# Patient Record
Sex: Male | Born: 1935 | Race: Black or African American | Hispanic: No | Marital: Married
Health system: Southern US, Community
[De-identification: ages and names within clinical notes are randomized; demographics above are authoritative.]

## PROBLEM LIST (undated history)

## (undated) DIAGNOSIS — F0150 Vascular dementia without behavioral disturbance: Secondary | ICD-10-CM

## (undated) DIAGNOSIS — I639 Cerebral infarction, unspecified: Secondary | ICD-10-CM

## (undated) DIAGNOSIS — I10 Essential (primary) hypertension: Secondary | ICD-10-CM

## (undated) DIAGNOSIS — J9611 Chronic respiratory failure with hypoxia: Secondary | ICD-10-CM

## (undated) DIAGNOSIS — J449 Chronic obstructive pulmonary disease, unspecified: Secondary | ICD-10-CM

## (undated) DIAGNOSIS — E119 Type 2 diabetes mellitus without complications: Secondary | ICD-10-CM

## (undated) DIAGNOSIS — I4891 Unspecified atrial fibrillation: Secondary | ICD-10-CM

---

## 2008-10-14 ENCOUNTER — Inpatient Hospital Stay: Payer: Self-pay | Admitting: Internal Medicine

## 2009-10-13 ENCOUNTER — Ambulatory Visit: Payer: Self-pay | Admitting: Internal Medicine

## 2011-04-05 ENCOUNTER — Ambulatory Visit: Payer: Self-pay | Admitting: Ophthalmology

## 2011-04-05 DIAGNOSIS — I2510 Atherosclerotic heart disease of native coronary artery without angina pectoris: Secondary | ICD-10-CM

## 2011-04-10 ENCOUNTER — Ambulatory Visit: Payer: Self-pay | Admitting: Ophthalmology

## 2011-06-20 ENCOUNTER — Ambulatory Visit: Payer: Self-pay | Admitting: Ophthalmology

## 2011-06-20 LAB — PROTIME-INR
INR: 2.2
Prothrombin Time: 24.3 secs — ABNORMAL HIGH (ref 11.5–14.7)

## 2011-06-20 LAB — POTASSIUM: Potassium: 3.9 mmol/L (ref 3.5–5.1)

## 2011-06-27 ENCOUNTER — Ambulatory Visit: Payer: Self-pay | Admitting: Ophthalmology

## 2013-10-14 ENCOUNTER — Inpatient Hospital Stay: Payer: Self-pay | Admitting: Internal Medicine

## 2013-10-14 LAB — HEMOGLOBIN A1C: HEMOGLOBIN A1C: 7.7 % — AB (ref 4.2–6.3)

## 2013-10-14 LAB — COMPREHENSIVE METABOLIC PANEL
ALT: 28 U/L (ref 12–78)
Albumin: 3.8 g/dL (ref 3.4–5.0)
Alkaline Phosphatase: 81 U/L
Anion Gap: 7 (ref 7–16)
BUN: 19 mg/dL — ABNORMAL HIGH (ref 7–18)
Bilirubin,Total: 1.3 mg/dL — ABNORMAL HIGH (ref 0.2–1.0)
CHLORIDE: 111 mmol/L — AB (ref 98–107)
CO2: 23 mmol/L (ref 21–32)
CREATININE: 1.93 mg/dL — AB (ref 0.60–1.30)
Calcium, Total: 9.2 mg/dL (ref 8.5–10.1)
EGFR (Non-African Amer.): 31 — ABNORMAL LOW
GFR CALC AF AMER: 36 — AB
Glucose: 125 mg/dL — ABNORMAL HIGH (ref 65–99)
Osmolality: 285 (ref 275–301)
POTASSIUM: 3.8 mmol/L (ref 3.5–5.1)
SGOT(AST): 32 U/L (ref 15–37)
SODIUM: 141 mmol/L (ref 136–145)
Total Protein: 8.7 g/dL — ABNORMAL HIGH (ref 6.4–8.2)

## 2013-10-14 LAB — CBC
HCT: 42.7 % (ref 40.0–52.0)
HGB: 13.6 g/dL (ref 13.0–18.0)
MCH: 26.2 pg (ref 26.0–34.0)
MCHC: 31.8 g/dL — AB (ref 32.0–36.0)
MCV: 82 fL (ref 80–100)
PLATELETS: 283 10*3/uL (ref 150–440)
RBC: 5.19 10*6/uL (ref 4.40–5.90)
RDW: 15.1 % — ABNORMAL HIGH (ref 11.5–14.5)
WBC: 8.4 10*3/uL (ref 3.8–10.6)

## 2013-10-14 LAB — PROTIME-INR
INR: 1.1
PROTHROMBIN TIME: 13.7 s (ref 11.5–14.7)

## 2013-10-14 LAB — TROPONIN I: Troponin-I: 0.04 ng/mL

## 2013-10-14 LAB — PRO B NATRIURETIC PEPTIDE: B-TYPE NATIURETIC PEPTID: 7008 pg/mL — AB (ref 0–450)

## 2013-10-15 LAB — CBC WITH DIFFERENTIAL/PLATELET
Basophil #: 0.1 10*3/uL (ref 0.0–0.1)
Basophil %: 1.3 %
EOS ABS: 0.1 10*3/uL (ref 0.0–0.7)
Eosinophil %: 1.7 %
HCT: 36.9 % — AB (ref 40.0–52.0)
HGB: 12.1 g/dL — AB (ref 13.0–18.0)
LYMPHS ABS: 2.1 10*3/uL (ref 1.0–3.6)
Lymphocyte %: 26.8 %
MCH: 26.4 pg (ref 26.0–34.0)
MCHC: 32.8 g/dL (ref 32.0–36.0)
MCV: 81 fL (ref 80–100)
MONOS PCT: 12.2 %
Monocyte #: 1 x10 3/mm (ref 0.2–1.0)
Neutrophil #: 4.5 10*3/uL (ref 1.4–6.5)
Neutrophil %: 58 %
PLATELETS: 268 10*3/uL (ref 150–440)
RBC: 4.59 10*6/uL (ref 4.40–5.90)
RDW: 14.9 % — ABNORMAL HIGH (ref 11.5–14.5)
WBC: 7.8 10*3/uL (ref 3.8–10.6)

## 2013-10-15 LAB — PROTIME-INR
INR: 1.1
Prothrombin Time: 14.4 secs (ref 11.5–14.7)

## 2013-10-15 LAB — LIPID PANEL
Cholesterol: 140 mg/dL (ref 0–200)
HDL Cholesterol: 34 mg/dL — ABNORMAL LOW (ref 40–60)
Ldl Cholesterol, Calc: 85 mg/dL (ref 0–100)
Triglycerides: 105 mg/dL (ref 0–200)
VLDL Cholesterol, Calc: 21 mg/dL (ref 5–40)

## 2013-10-15 LAB — BASIC METABOLIC PANEL
Anion Gap: 6 — ABNORMAL LOW (ref 7–16)
BUN: 22 mg/dL — ABNORMAL HIGH (ref 7–18)
Calcium, Total: 9 mg/dL (ref 8.5–10.1)
Chloride: 106 mmol/L (ref 98–107)
Co2: 27 mmol/L (ref 21–32)
Creatinine: 1.81 mg/dL — ABNORMAL HIGH (ref 0.60–1.30)
EGFR (African American): 39 — ABNORMAL LOW
GFR CALC NON AF AMER: 34 — AB
Glucose: 156 mg/dL — ABNORMAL HIGH (ref 65–99)
OSMOLALITY: 284 (ref 275–301)
Potassium: 3.5 mmol/L (ref 3.5–5.1)
Sodium: 139 mmol/L (ref 136–145)

## 2013-10-15 LAB — MAGNESIUM: MAGNESIUM: 2 mg/dL

## 2013-10-15 LAB — TSH: Thyroid Stimulating Horm: 3.71 u[IU]/mL

## 2013-10-15 LAB — PRO B NATRIURETIC PEPTIDE: B-TYPE NATIURETIC PEPTID: 11267 pg/mL — AB (ref 0–450)

## 2013-10-16 LAB — BASIC METABOLIC PANEL
ANION GAP: 8 (ref 7–16)
BUN: 28 mg/dL — AB (ref 7–18)
CREATININE: 2.01 mg/dL — AB (ref 0.60–1.30)
Calcium, Total: 8.5 mg/dL (ref 8.5–10.1)
Chloride: 104 mmol/L (ref 98–107)
Co2: 27 mmol/L (ref 21–32)
EGFR (African American): 35 — ABNORMAL LOW
EGFR (Non-African Amer.): 30 — ABNORMAL LOW
GLUCOSE: 151 mg/dL — AB (ref 65–99)
OSMOLALITY: 286 (ref 275–301)
Potassium: 3.6 mmol/L (ref 3.5–5.1)
Sodium: 139 mmol/L (ref 136–145)

## 2013-10-16 LAB — PROTIME-INR
INR: 1.1
PROTHROMBIN TIME: 14.5 s (ref 11.5–14.7)

## 2013-10-17 LAB — BASIC METABOLIC PANEL
Anion Gap: 5 — ABNORMAL LOW (ref 7–16)
BUN: 42 mg/dL — AB (ref 7–18)
CO2: 27 mmol/L (ref 21–32)
Calcium, Total: 8.3 mg/dL — ABNORMAL LOW (ref 8.5–10.1)
Chloride: 105 mmol/L (ref 98–107)
Creatinine: 2.52 mg/dL — ABNORMAL HIGH (ref 0.60–1.30)
EGFR (African American): 26 — ABNORMAL LOW
EGFR (Non-African Amer.): 23 — ABNORMAL LOW
GLUCOSE: 164 mg/dL — AB (ref 65–99)
Osmolality: 288 (ref 275–301)
Potassium: 4.2 mmol/L (ref 3.5–5.1)
Sodium: 137 mmol/L (ref 136–145)

## 2013-10-17 LAB — PROTIME-INR
INR: 1.3
PROTHROMBIN TIME: 15.6 s — AB (ref 11.5–14.7)

## 2013-10-18 LAB — BASIC METABOLIC PANEL
Anion Gap: 8 (ref 7–16)
BUN: 51 mg/dL — ABNORMAL HIGH (ref 7–18)
CALCIUM: 8.1 mg/dL — AB (ref 8.5–10.1)
CREATININE: 2.64 mg/dL — AB (ref 0.60–1.30)
Chloride: 103 mmol/L (ref 98–107)
Co2: 28 mmol/L (ref 21–32)
EGFR (African American): 25 — ABNORMAL LOW
EGFR (Non-African Amer.): 21 — ABNORMAL LOW
Glucose: 146 mg/dL — ABNORMAL HIGH (ref 65–99)
Osmolality: 294 (ref 275–301)
Potassium: 4.3 mmol/L (ref 3.5–5.1)
SODIUM: 139 mmol/L (ref 136–145)

## 2013-10-18 LAB — PHOSPHORUS: PHOSPHORUS: 2.6 mg/dL (ref 2.5–4.9)

## 2013-10-18 LAB — SODIUM, URINE, RANDOM: Sodium, Urine Random: 17 mmol/L (ref 20–110)

## 2013-10-18 LAB — PROTIME-INR
INR: 1.3
Prothrombin Time: 16.1 secs — ABNORMAL HIGH (ref 11.5–14.7)

## 2013-10-18 LAB — PROTEIN / CREATININE RATIO, URINE
Creatinine, Urine: 62.3 mg/dL (ref 30.0–125.0)
PROTEIN/CREAT. RATIO: 273 mg/g{creat} — AB (ref 0–200)
Protein, Random Urine: 17 mg/dL — ABNORMAL HIGH (ref 0–12)

## 2013-10-19 LAB — BASIC METABOLIC PANEL
ANION GAP: 7 (ref 7–16)
BUN: 40 mg/dL — ABNORMAL HIGH (ref 7–18)
CALCIUM: 8.3 mg/dL — AB (ref 8.5–10.1)
CHLORIDE: 106 mmol/L (ref 98–107)
CREATININE: 2.41 mg/dL — AB (ref 0.60–1.30)
Co2: 26 mmol/L (ref 21–32)
EGFR (African American): 28 — ABNORMAL LOW
EGFR (Non-African Amer.): 24 — ABNORMAL LOW
GLUCOSE: 122 mg/dL — AB (ref 65–99)
Osmolality: 289 (ref 275–301)
Potassium: 4 mmol/L (ref 3.5–5.1)
Sodium: 139 mmol/L (ref 136–145)

## 2013-10-19 LAB — PROTIME-INR
INR: 1.5
Prothrombin Time: 18 secs — ABNORMAL HIGH (ref 11.5–14.7)

## 2013-10-19 LAB — HEMOGLOBIN: HGB: 11.1 g/dL — ABNORMAL LOW (ref 13.0–18.0)

## 2013-10-20 LAB — PROTEIN ELECTROPHORESIS(ARMC)

## 2013-10-21 LAB — UR PROT ELECTROPHORESIS, URINE RANDOM

## 2013-12-12 ENCOUNTER — Inpatient Hospital Stay: Payer: Self-pay | Admitting: Internal Medicine

## 2013-12-12 LAB — COMPREHENSIVE METABOLIC PANEL
ALK PHOS: 54 U/L
ALT: 27 U/L
ANION GAP: 9 (ref 7–16)
AST: 29 U/L (ref 15–37)
Albumin: 3.6 g/dL (ref 3.4–5.0)
BUN: 27 mg/dL — ABNORMAL HIGH (ref 7–18)
Bilirubin,Total: 0.6 mg/dL (ref 0.2–1.0)
CALCIUM: 8.7 mg/dL (ref 8.5–10.1)
CO2: 27 mmol/L (ref 21–32)
CREATININE: 2.02 mg/dL — AB (ref 0.60–1.30)
Chloride: 105 mmol/L (ref 98–107)
EGFR (African American): 34 — ABNORMAL LOW
EGFR (Non-African Amer.): 30 — ABNORMAL LOW
GLUCOSE: 123 mg/dL — AB (ref 65–99)
Osmolality: 288 (ref 275–301)
Potassium: 3.6 mmol/L (ref 3.5–5.1)
SODIUM: 141 mmol/L (ref 136–145)
Total Protein: 8.5 g/dL — ABNORMAL HIGH (ref 6.4–8.2)

## 2013-12-12 LAB — CBC
HCT: 39.9 % — ABNORMAL LOW (ref 40.0–52.0)
HGB: 12.7 g/dL — ABNORMAL LOW (ref 13.0–18.0)
MCH: 25.4 pg — ABNORMAL LOW (ref 26.0–34.0)
MCHC: 31.8 g/dL — AB (ref 32.0–36.0)
MCV: 80 fL (ref 80–100)
Platelet: 195 10*3/uL (ref 150–440)
RBC: 4.99 10*6/uL (ref 4.40–5.90)
RDW: 15.5 % — ABNORMAL HIGH (ref 11.5–14.5)
WBC: 6.5 10*3/uL (ref 3.8–10.6)

## 2013-12-12 LAB — URINALYSIS, COMPLETE
BACTERIA: NONE SEEN
Bilirubin,UR: NEGATIVE
GLUCOSE, UR: NEGATIVE mg/dL (ref 0–75)
Ketone: NEGATIVE
Leukocyte Esterase: NEGATIVE
Nitrite: NEGATIVE
Ph: 6 (ref 4.5–8.0)
Protein: NEGATIVE
RBC,UR: 1 /HPF (ref 0–5)
Specific Gravity: 1.005 (ref 1.003–1.030)
WBC UR: NONE SEEN /HPF (ref 0–5)

## 2013-12-12 LAB — PRO B NATRIURETIC PEPTIDE: B-TYPE NATIURETIC PEPTID: 4875 pg/mL — AB (ref 0–450)

## 2013-12-12 LAB — CK TOTAL AND CKMB (NOT AT ARMC)
CK, TOTAL: 231 U/L
CK-MB: 2.3 ng/mL (ref 0.5–3.6)

## 2013-12-12 LAB — TROPONIN I: Troponin-I: <0.02 ng/mL

## 2013-12-12 LAB — PROTIME-INR
INR: 3.1
PROTHROMBIN TIME: 31.2 s — AB (ref 11.5–14.7)

## 2013-12-13 LAB — BASIC METABOLIC PANEL
ANION GAP: 5 — AB (ref 7–16)
BUN: 27 mg/dL — AB (ref 7–18)
Calcium, Total: 8.6 mg/dL (ref 8.5–10.1)
Chloride: 107 mmol/L (ref 98–107)
Co2: 30 mmol/L (ref 21–32)
Creatinine: 1.95 mg/dL — ABNORMAL HIGH (ref 0.60–1.30)
EGFR (African American): 36 — ABNORMAL LOW
EGFR (Non-African Amer.): 31 — ABNORMAL LOW
Glucose: 85 mg/dL (ref 65–99)
OSMOLALITY: 287 (ref 275–301)
POTASSIUM: 3.5 mmol/L (ref 3.5–5.1)
Sodium: 142 mmol/L (ref 136–145)

## 2013-12-13 LAB — CBC WITH DIFFERENTIAL/PLATELET
Basophil #: 0.1 10*3/uL (ref 0.0–0.1)
Basophil %: 1.1 %
EOS PCT: 4.7 %
Eosinophil #: 0.3 10*3/uL (ref 0.0–0.7)
HCT: 38.8 % — ABNORMAL LOW (ref 40.0–52.0)
HGB: 12.6 g/dL — AB (ref 13.0–18.0)
LYMPHS ABS: 2.6 10*3/uL (ref 1.0–3.6)
Lymphocyte %: 46.3 %
MCH: 25.7 pg — ABNORMAL LOW (ref 26.0–34.0)
MCHC: 32.5 g/dL (ref 32.0–36.0)
MCV: 79 fL — ABNORMAL LOW (ref 80–100)
Monocyte #: 0.8 x10 3/mm (ref 0.2–1.0)
Monocyte %: 13.6 %
Neutrophil #: 1.9 10*3/uL (ref 1.4–6.5)
Neutrophil %: 34.3 %
Platelet: 185 10*3/uL (ref 150–440)
RBC: 4.91 10*6/uL (ref 4.40–5.90)
RDW: 15.4 % — AB (ref 11.5–14.5)
WBC: 5.5 10*3/uL (ref 3.8–10.6)

## 2013-12-13 LAB — PROTIME-INR
INR: 3.2
PROTHROMBIN TIME: 31.6 s — AB (ref 11.5–14.7)

## 2013-12-14 LAB — BASIC METABOLIC PANEL
Anion Gap: 7 (ref 7–16)
BUN: 34 mg/dL — ABNORMAL HIGH (ref 7–18)
CHLORIDE: 107 mmol/L (ref 98–107)
CO2: 28 mmol/L (ref 21–32)
CREATININE: 2.15 mg/dL — AB (ref 0.60–1.30)
Calcium, Total: 8.8 mg/dL (ref 8.5–10.1)
EGFR (Non-African Amer.): 27 — ABNORMAL LOW
GFR CALC AF AMER: 32 — AB
Glucose: 137 mg/dL — ABNORMAL HIGH (ref 65–99)
Osmolality: 293 (ref 275–301)
Potassium: 3.7 mmol/L (ref 3.5–5.1)
SODIUM: 142 mmol/L (ref 136–145)

## 2013-12-14 LAB — PROTIME-INR
INR: 2.4
PROTHROMBIN TIME: 25.4 s — AB (ref 11.5–14.7)

## 2013-12-15 LAB — PROTIME-INR
INR: 2.6
Prothrombin Time: 27.3 secs — ABNORMAL HIGH (ref 11.5–14.7)

## 2014-01-06 ENCOUNTER — Ambulatory Visit: Payer: Self-pay | Admitting: Internal Medicine

## 2014-01-18 ENCOUNTER — Inpatient Hospital Stay: Payer: Self-pay | Admitting: Internal Medicine

## 2014-01-18 LAB — COMPREHENSIVE METABOLIC PANEL
ALBUMIN: 3.1 g/dL — AB (ref 3.4–5.0)
ALT: 64 U/L — AB
ANION GAP: 8 (ref 7–16)
AST: 33 U/L (ref 15–37)
Alkaline Phosphatase: 106 U/L
BILIRUBIN TOTAL: 1.1 mg/dL — AB (ref 0.2–1.0)
BUN: 30 mg/dL — AB (ref 7–18)
CO2: 22 mmol/L (ref 21–32)
Calcium, Total: 8.2 mg/dL — ABNORMAL LOW (ref 8.5–10.1)
Chloride: 115 mmol/L — ABNORMAL HIGH (ref 98–107)
Creatinine: 2.16 mg/dL — ABNORMAL HIGH (ref 0.60–1.30)
EGFR (African American): 32 — ABNORMAL LOW
GFR CALC NON AF AMER: 27 — AB
GLUCOSE: 185 mg/dL — AB (ref 65–99)
Osmolality: 300 (ref 275–301)
Potassium: 3.9 mmol/L (ref 3.5–5.1)
SODIUM: 145 mmol/L (ref 136–145)
TOTAL PROTEIN: 7.6 g/dL (ref 6.4–8.2)

## 2014-01-18 LAB — MAGNESIUM: Magnesium: 2.3 mg/dL

## 2014-01-18 LAB — CBC WITH DIFFERENTIAL/PLATELET
Basophil #: 0.1 10*3/uL (ref 0.0–0.1)
Basophil %: 0.8 %
Eosinophil #: 0.2 10*3/uL (ref 0.0–0.7)
Eosinophil %: 2.6 %
HCT: 32.4 % — ABNORMAL LOW (ref 40.0–52.0)
HGB: 10.7 g/dL — ABNORMAL LOW (ref 13.0–18.0)
Lymphocyte #: 1.3 10*3/uL (ref 1.0–3.6)
Lymphocyte %: 15.6 %
MCH: 26.3 pg (ref 26.0–34.0)
MCHC: 33.2 g/dL (ref 32.0–36.0)
MCV: 79 fL — AB (ref 80–100)
MONOS PCT: 13.4 %
Monocyte #: 1.1 x10 3/mm — ABNORMAL HIGH (ref 0.2–1.0)
NEUTROS PCT: 67.6 %
Neutrophil #: 5.7 10*3/uL (ref 1.4–6.5)
PLATELETS: 202 10*3/uL (ref 150–440)
RBC: 4.08 10*6/uL — ABNORMAL LOW (ref 4.40–5.90)
RDW: 17 % — ABNORMAL HIGH (ref 11.5–14.5)
WBC: 8.4 10*3/uL (ref 3.8–10.6)

## 2014-01-18 LAB — URINALYSIS, COMPLETE
Bacteria: NONE SEEN
Bilirubin,UR: NEGATIVE
Glucose,UR: NEGATIVE mg/dL (ref 0–75)
KETONE: NEGATIVE
LEUKOCYTE ESTERASE: NEGATIVE
Nitrite: NEGATIVE
PH: 6 (ref 4.5–8.0)
SPECIFIC GRAVITY: 1.01 (ref 1.003–1.030)
WBC UR: 1 /HPF (ref 0–5)

## 2014-01-18 LAB — PROTIME-INR
INR: 3.1
PROTHROMBIN TIME: 31.2 s — AB (ref 11.5–14.7)

## 2014-01-18 LAB — PRO B NATRIURETIC PEPTIDE: B-Type Natriuretic Peptide: 10541 pg/mL — ABNORMAL HIGH (ref 0–450)

## 2014-01-18 LAB — PHOSPHORUS: PHOSPHORUS: 2.7 mg/dL (ref 2.5–4.9)

## 2014-01-18 LAB — TROPONIN I: Troponin-I: 0.03 ng/mL

## 2014-01-19 LAB — PROTIME-INR
INR: 3.2
Prothrombin Time: 32.1 secs — ABNORMAL HIGH (ref 11.5–14.7)

## 2014-01-20 LAB — CBC WITH DIFFERENTIAL/PLATELET
Basophil #: 0.1 10*3/uL (ref 0.0–0.1)
Basophil %: 1.1 %
Eosinophil #: 0.3 10*3/uL (ref 0.0–0.7)
Eosinophil %: 3.4 %
HCT: 32.4 % — ABNORMAL LOW (ref 40.0–52.0)
HGB: 10.3 g/dL — ABNORMAL LOW (ref 13.0–18.0)
LYMPHS PCT: 22.6 %
Lymphocyte #: 2 10*3/uL (ref 1.0–3.6)
MCH: 25.6 pg — ABNORMAL LOW (ref 26.0–34.0)
MCHC: 31.9 g/dL — AB (ref 32.0–36.0)
MCV: 80 fL (ref 80–100)
MONO ABS: 1.2 x10 3/mm — AB (ref 0.2–1.0)
MONOS PCT: 13.6 %
NEUTROS ABS: 5.3 10*3/uL (ref 1.4–6.5)
Neutrophil %: 59.3 %
Platelet: 210 10*3/uL (ref 150–440)
RBC: 4.04 10*6/uL — AB (ref 4.40–5.90)
RDW: 16.8 % — ABNORMAL HIGH (ref 11.5–14.5)
WBC: 9 10*3/uL (ref 3.8–10.6)

## 2014-01-20 LAB — BASIC METABOLIC PANEL
Anion Gap: 10 (ref 7–16)
BUN: 29 mg/dL — AB (ref 7–18)
CO2: 22 mmol/L (ref 21–32)
Calcium, Total: 8.1 mg/dL — ABNORMAL LOW (ref 8.5–10.1)
Chloride: 113 mmol/L — ABNORMAL HIGH (ref 98–107)
Creatinine: 2.15 mg/dL — ABNORMAL HIGH (ref 0.60–1.30)
EGFR (African American): 32 — ABNORMAL LOW
EGFR (Non-African Amer.): 27 — ABNORMAL LOW
Glucose: 130 mg/dL — ABNORMAL HIGH (ref 65–99)
Osmolality: 296 (ref 275–301)
Potassium: 3.8 mmol/L (ref 3.5–5.1)
SODIUM: 145 mmol/L (ref 136–145)

## 2014-01-20 LAB — PROTIME-INR
INR: 3.6
PROTHROMBIN TIME: 34.9 s — AB (ref 11.5–14.7)

## 2014-01-20 LAB — MAGNESIUM: MAGNESIUM: 2.4 mg/dL

## 2014-01-20 LAB — URINE CULTURE

## 2014-01-21 LAB — BASIC METABOLIC PANEL
Anion Gap: 6 — ABNORMAL LOW (ref 7–16)
BUN: 33 mg/dL — ABNORMAL HIGH (ref 7–18)
CO2: 26 mmol/L (ref 21–32)
CREATININE: 2.06 mg/dL — AB (ref 0.60–1.30)
Calcium, Total: 8.7 mg/dL (ref 8.5–10.1)
Chloride: 108 mmol/L — ABNORMAL HIGH (ref 98–107)
EGFR (African American): 34 — ABNORMAL LOW
EGFR (Non-African Amer.): 29 — ABNORMAL LOW
Glucose: 305 mg/dL — ABNORMAL HIGH (ref 65–99)
Osmolality: 298 (ref 275–301)
Potassium: 4.2 mmol/L (ref 3.5–5.1)
Sodium: 140 mmol/L (ref 136–145)

## 2014-01-21 LAB — CBC WITH DIFFERENTIAL/PLATELET
BASOS ABS: 0 10*3/uL (ref 0.0–0.1)
Basophil %: 0.2 %
EOS PCT: 0.1 %
Eosinophil #: 0 10*3/uL (ref 0.0–0.7)
HCT: 33.6 % — AB (ref 40.0–52.0)
HGB: 11 g/dL — ABNORMAL LOW (ref 13.0–18.0)
LYMPHS PCT: 14.6 %
Lymphocyte #: 0.9 10*3/uL — ABNORMAL LOW (ref 1.0–3.6)
MCH: 26 pg (ref 26.0–34.0)
MCHC: 32.8 g/dL (ref 32.0–36.0)
MCV: 79 fL — ABNORMAL LOW (ref 80–100)
Monocyte #: 0.2 x10 3/mm (ref 0.2–1.0)
Monocyte %: 2.7 %
NEUTROS PCT: 82.4 %
Neutrophil #: 4.9 10*3/uL (ref 1.4–6.5)
PLATELETS: 252 10*3/uL (ref 150–440)
RBC: 4.24 10*6/uL — AB (ref 4.40–5.90)
RDW: 17.7 % — AB (ref 11.5–14.5)
WBC: 5.9 10*3/uL (ref 3.8–10.6)

## 2014-01-21 LAB — PROTIME-INR
INR: 3.3
Prothrombin Time: 32.3 secs — ABNORMAL HIGH (ref 11.5–14.7)

## 2014-01-22 LAB — CREATININE, SERUM
CREATININE: 2.06 mg/dL — AB (ref 0.60–1.30)
EGFR (African American): 34 — ABNORMAL LOW
EGFR (Non-African Amer.): 29 — ABNORMAL LOW

## 2014-01-22 LAB — VANCOMYCIN, TROUGH: VANCOMYCIN, TROUGH: 14 ug/mL (ref 10–20)

## 2014-01-22 LAB — PROTIME-INR
INR: 4.2
Prothrombin Time: 38.9 secs — ABNORMAL HIGH (ref 11.5–14.7)

## 2014-01-23 LAB — CULTURE, BLOOD (SINGLE)

## 2014-01-26 ENCOUNTER — Emergency Department: Payer: Self-pay | Admitting: Emergency Medicine

## 2014-01-26 LAB — COMPREHENSIVE METABOLIC PANEL
ALK PHOS: 74 U/L
ALT: 93 U/L — AB
ANION GAP: 3 — AB (ref 7–16)
AST: 18 U/L (ref 15–37)
Albumin: 3.1 g/dL — ABNORMAL LOW (ref 3.4–5.0)
BUN: 38 mg/dL — ABNORMAL HIGH (ref 7–18)
Bilirubin,Total: 0.4 mg/dL (ref 0.2–1.0)
Calcium, Total: 8 mg/dL — ABNORMAL LOW (ref 8.5–10.1)
Chloride: 108 mmol/L — ABNORMAL HIGH (ref 98–107)
Co2: 28 mmol/L (ref 21–32)
Creatinine: 1.8 mg/dL — ABNORMAL HIGH (ref 0.60–1.30)
GFR CALC AF AMER: 39 — AB
GFR CALC NON AF AMER: 34 — AB
GLUCOSE: 202 mg/dL — AB (ref 65–99)
OSMOLALITY: 292 (ref 275–301)
Potassium: 3.7 mmol/L (ref 3.5–5.1)
Sodium: 139 mmol/L (ref 136–145)
Total Protein: 7.3 g/dL (ref 6.4–8.2)

## 2014-01-26 LAB — CBC
HCT: 33.7 % — AB (ref 40.0–52.0)
HGB: 10.5 g/dL — AB (ref 13.0–18.0)
MCH: 24.9 pg — AB (ref 26.0–34.0)
MCHC: 31 g/dL — AB (ref 32.0–36.0)
MCV: 81 fL (ref 80–100)
PLATELETS: 290 10*3/uL (ref 150–440)
RBC: 4.19 10*6/uL — AB (ref 4.40–5.90)
RDW: 17.3 % — ABNORMAL HIGH (ref 11.5–14.5)
WBC: 8.8 10*3/uL (ref 3.8–10.6)

## 2014-01-26 LAB — URINALYSIS, COMPLETE
Bacteria: NONE SEEN
Bilirubin,UR: NEGATIVE
Blood: NEGATIVE
Glucose,UR: NEGATIVE mg/dL (ref 0–75)
KETONE: NEGATIVE
Leukocyte Esterase: NEGATIVE
Nitrite: NEGATIVE
PROTEIN: NEGATIVE
Ph: 6 (ref 4.5–8.0)
Specific Gravity: 1.01 (ref 1.003–1.030)
WBC UR: <1 /HPF (ref 0–5)

## 2014-01-26 LAB — TROPONIN I

## 2014-01-28 ENCOUNTER — Emergency Department: Payer: Self-pay | Admitting: Emergency Medicine

## 2014-01-29 LAB — COMPREHENSIVE METABOLIC PANEL
ALK PHOS: 83 U/L
ANION GAP: 5 — AB (ref 7–16)
Albumin: 3.2 g/dL — ABNORMAL LOW (ref 3.4–5.0)
BUN: 39 mg/dL — AB (ref 7–18)
Bilirubin,Total: 0.4 mg/dL (ref 0.2–1.0)
CALCIUM: 7.8 mg/dL — AB (ref 8.5–10.1)
Chloride: 102 mmol/L (ref 98–107)
Co2: 31 mmol/L (ref 21–32)
Creatinine: 1.8 mg/dL — ABNORMAL HIGH (ref 0.60–1.30)
EGFR (African American): 47 — ABNORMAL LOW
EGFR (Non-African Amer.): 38 — ABNORMAL LOW
GLUCOSE: 348 mg/dL — AB (ref 65–99)
Osmolality: 299 (ref 275–301)
POTASSIUM: 4.2 mmol/L (ref 3.5–5.1)
SGOT(AST): 11 U/L — ABNORMAL LOW (ref 15–37)
SGPT (ALT): 56 U/L
SODIUM: 138 mmol/L (ref 136–145)
Total Protein: 7.1 g/dL (ref 6.4–8.2)

## 2014-01-29 LAB — CBC WITH DIFFERENTIAL/PLATELET
Basophil #: 0 10*3/uL (ref 0.0–0.1)
Basophil %: 0.6 %
EOS ABS: 0 10*3/uL (ref 0.0–0.7)
EOS PCT: 0.3 %
HCT: 37.3 % — ABNORMAL LOW (ref 40.0–52.0)
HGB: 11.9 g/dL — ABNORMAL LOW (ref 13.0–18.0)
Lymphocyte #: 1.3 10*3/uL (ref 1.0–3.6)
Lymphocyte %: 17.6 %
MCH: 25.7 pg — ABNORMAL LOW (ref 26.0–34.0)
MCHC: 31.9 g/dL — ABNORMAL LOW (ref 32.0–36.0)
MCV: 81 fL (ref 80–100)
MONOS PCT: 10.4 %
Monocyte #: 0.8 x10 3/mm (ref 0.2–1.0)
NEUTROS PCT: 71.1 %
Neutrophil #: 5.3 10*3/uL (ref 1.4–6.5)
PLATELETS: 326 10*3/uL (ref 150–440)
RBC: 4.63 10*6/uL (ref 4.40–5.90)
RDW: 17.7 % — AB (ref 11.5–14.5)
WBC: 7.4 10*3/uL (ref 3.8–10.6)

## 2014-01-29 LAB — TROPONIN I: Troponin-I: <0.02 ng/mL

## 2014-01-29 LAB — PRO B NATRIURETIC PEPTIDE: B-Type Natriuretic Peptide: 3971 pg/mL — ABNORMAL HIGH (ref 0–450)

## 2014-01-31 ENCOUNTER — Emergency Department: Payer: Self-pay | Admitting: Emergency Medicine

## 2014-01-31 LAB — URINALYSIS, COMPLETE
Bacteria: NONE SEEN
Bilirubin,UR: NEGATIVE
GLUCOSE, UR: NEGATIVE mg/dL (ref 0–75)
KETONE: NEGATIVE
LEUKOCYTE ESTERASE: NEGATIVE
NITRITE: NEGATIVE
PH: 7 (ref 4.5–8.0)
Protein: NEGATIVE
RBC,UR: 30 /HPF (ref 0–5)
Specific Gravity: 1.006 (ref 1.003–1.030)
Squamous Epithelial: <1

## 2014-01-31 LAB — CBC WITH DIFFERENTIAL/PLATELET
BASOS PCT: 0.8 %
Basophil #: 0.1 10*3/uL (ref 0.0–0.1)
EOS ABS: 0.2 10*3/uL (ref 0.0–0.7)
EOS PCT: 2.9 %
HCT: 36.8 % — ABNORMAL LOW (ref 40.0–52.0)
HGB: 11.6 g/dL — ABNORMAL LOW (ref 13.0–18.0)
Lymphocyte #: 2.1 10*3/uL (ref 1.0–3.6)
Lymphocyte %: 28.6 %
MCH: 25.4 pg — ABNORMAL LOW (ref 26.0–34.0)
MCHC: 31.5 g/dL — ABNORMAL LOW (ref 32.0–36.0)
MCV: 81 fL (ref 80–100)
Monocyte #: 1.1 x10 3/mm — ABNORMAL HIGH (ref 0.2–1.0)
Monocyte %: 15.6 %
NEUTROS ABS: 3.8 10*3/uL (ref 1.4–6.5)
NEUTROS PCT: 52.1 %
Platelet: 274 10*3/uL (ref 150–440)
RBC: 4.55 10*6/uL (ref 4.40–5.90)
RDW: 18.1 % — ABNORMAL HIGH (ref 11.5–14.5)
WBC: 7.4 10*3/uL (ref 3.8–10.6)

## 2014-01-31 LAB — BASIC METABOLIC PANEL
ANION GAP: 6 — AB (ref 7–16)
BUN: 29 mg/dL — ABNORMAL HIGH (ref 7–18)
CALCIUM: 7.5 mg/dL — AB (ref 8.5–10.1)
CHLORIDE: 109 mmol/L — AB (ref 98–107)
CO2: 27 mmol/L (ref 21–32)
Creatinine: 1.79 mg/dL — ABNORMAL HIGH (ref 0.60–1.30)
EGFR (African American): 47 — ABNORMAL LOW
EGFR (Non-African Amer.): 39 — ABNORMAL LOW
Glucose: 135 mg/dL — ABNORMAL HIGH (ref 65–99)
Osmolality: 291 (ref 275–301)
POTASSIUM: 3.7 mmol/L (ref 3.5–5.1)
Sodium: 142 mmol/L (ref 136–145)

## 2014-01-31 LAB — TROPONIN I

## 2014-02-02 LAB — URINE CULTURE

## 2014-02-05 ENCOUNTER — Ambulatory Visit: Payer: Self-pay | Admitting: Internal Medicine

## 2014-03-11 ENCOUNTER — Ambulatory Visit: Payer: Self-pay | Admitting: Internal Medicine

## 2014-07-08 ENCOUNTER — Inpatient Hospital Stay: Payer: Self-pay | Admitting: Internal Medicine

## 2014-08-03 ENCOUNTER — Inpatient Hospital Stay
Admit: 2014-08-03 | Disposition: A | Discharge status: Skilled Nursing Facility | Payer: Self-pay | Attending: Internal Medicine | Admitting: Internal Medicine

## 2014-08-03 LAB — COMPREHENSIVE METABOLIC PANEL
ALT: 15 U/L — AB
AST: 27 U/L
Albumin: 2 g/dL — ABNORMAL LOW
Alkaline Phosphatase: 161 U/L — ABNORMAL HIGH
Anion Gap: 6 — ABNORMAL LOW (ref 7–16)
BUN: 34 mg/dL — AB
Bilirubin,Total: 1.2 mg/dL
CALCIUM: 8 mg/dL — AB
CO2: 25 mmol/L
Chloride: 118 mmol/L — ABNORMAL HIGH
Creatinine: 1.66 mg/dL — ABNORMAL HIGH
GFR CALC AF AMER: 44 — AB
GFR CALC NON AF AMER: 38 — AB
Glucose: 114 mg/dL — ABNORMAL HIGH
POTASSIUM: 4.1 mmol/L
Sodium: 149 mmol/L — ABNORMAL HIGH
TOTAL PROTEIN: 7.1 g/dL

## 2014-08-03 LAB — PROTIME-INR
INR: 2.8
PROTHROMBIN TIME: 29.4 s — AB

## 2014-08-03 LAB — URINALYSIS, COMPLETE
BILIRUBIN, UR: NEGATIVE
Blood: NEGATIVE
Glucose,UR: NEGATIVE mg/dL (ref 0–75)
KETONE: NEGATIVE
LEUKOCYTE ESTERASE: NEGATIVE
Nitrite: NEGATIVE
Ph: 6 (ref 4.5–8.0)
Protein: NEGATIVE
RBC,UR: NONE SEEN /HPF (ref 0–5)
Specific Gravity: 1.009 (ref 1.003–1.030)

## 2014-08-03 LAB — CBC
HCT: 33.9 % — ABNORMAL LOW (ref 40.0–52.0)
HGB: 10.8 g/dL — ABNORMAL LOW (ref 13.0–18.0)
MCH: 23.7 pg — ABNORMAL LOW (ref 26.0–34.0)
MCHC: 31.8 g/dL — AB (ref 32.0–36.0)
MCV: 74 fL — ABNORMAL LOW (ref 80–100)
Platelet: 299 10*3/uL (ref 150–440)
RBC: 4.56 10*6/uL (ref 4.40–5.90)
RDW: 17.8 % — AB (ref 11.5–14.5)
WBC: 9.1 10*3/uL (ref 3.8–10.6)

## 2014-08-03 LAB — TROPONIN I: TROPONIN-I: 0.04 ng/mL — AB

## 2014-08-04 LAB — CBC WITH DIFFERENTIAL/PLATELET
Basophil #: 0 10*3/uL (ref 0.0–0.1)
Basophil %: 0.5 %
EOS ABS: 0.2 10*3/uL (ref 0.0–0.7)
Eosinophil %: 1.8 %
HCT: 30.9 % — ABNORMAL LOW (ref 40.0–52.0)
HGB: 9.7 g/dL — ABNORMAL LOW (ref 13.0–18.0)
LYMPHS ABS: 2 10*3/uL (ref 1.0–3.6)
Lymphocyte %: 21.4 %
MCH: 23.8 pg — AB (ref 26.0–34.0)
MCHC: 31.3 g/dL — AB (ref 32.0–36.0)
MCV: 76 fL — ABNORMAL LOW (ref 80–100)
Monocyte #: 1.1 x10 3/mm — ABNORMAL HIGH (ref 0.2–1.0)
Monocyte %: 12 %
NEUTROS ABS: 5.9 10*3/uL (ref 1.4–6.5)
Neutrophil %: 64.3 %
Platelet: 318 10*3/uL (ref 150–440)
RBC: 4.07 10*6/uL — ABNORMAL LOW (ref 4.40–5.90)
RDW: 17.9 % — ABNORMAL HIGH (ref 11.5–14.5)
WBC: 9.1 10*3/uL (ref 3.8–10.6)

## 2014-08-04 LAB — BASIC METABOLIC PANEL
ANION GAP: 6 — AB (ref 7–16)
BUN: 30 mg/dL — ABNORMAL HIGH
CHLORIDE: 119 mmol/L — AB
CREATININE: 1.59 mg/dL — AB
Calcium, Total: 7.5 mg/dL — ABNORMAL LOW
Co2: 23 mmol/L
EGFR (African American): 46 — ABNORMAL LOW
GFR CALC NON AF AMER: 40 — AB
Glucose: 96 mg/dL
POTASSIUM: 5.1 mmol/L
SODIUM: 148 mmol/L — AB

## 2014-08-04 LAB — MRSA PCR SCREENING

## 2014-08-05 ENCOUNTER — Ambulatory Visit: Admit: 2014-08-05 | Disposition: A | Discharge status: Home / Self Care | Payer: Self-pay | Admitting: Neurology

## 2014-08-05 LAB — BASIC METABOLIC PANEL
ANION GAP: 6 — AB (ref 7–16)
BUN: 29 mg/dL — ABNORMAL HIGH
CO2: 23 mmol/L
CREATININE: 1.52 mg/dL — AB
Calcium, Total: 7.8 mg/dL — ABNORMAL LOW
Chloride: 116 mmol/L — ABNORMAL HIGH
EGFR (Non-African Amer.): 42 — ABNORMAL LOW
GFR CALC AF AMER: 48 — AB
Glucose: 92 mg/dL
Potassium: 4.2 mmol/L
Sodium: 145 mmol/L

## 2014-08-05 LAB — PROTIME-INR
INR: 3.7
PROTHROMBIN TIME: 37 s — AB

## 2014-08-06 LAB — PROTIME-INR
INR: 3.7
PROTHROMBIN TIME: 36.7 s — AB

## 2014-08-08 LAB — CULTURE, BLOOD (SINGLE)

## 2014-08-10 ENCOUNTER — Other Ambulatory Visit: Admit: 2014-08-10 | Disposition: A | Discharge status: Home / Self Care | Payer: Self-pay

## 2014-08-10 LAB — PROTIME-INR
INR: 3.5
PROTHROMBIN TIME: 35.2 s — AB

## 2014-08-18 ENCOUNTER — Emergency Department
Admit: 2014-08-18 | Disposition: A | Discharge status: Home / Self Care | Payer: Self-pay | Admitting: Emergency Medicine

## 2014-08-18 LAB — URINALYSIS, COMPLETE
BACTERIA: NONE SEEN
BILIRUBIN, UR: NEGATIVE
Blood: NEGATIVE
GLUCOSE, UR: NEGATIVE mg/dL (ref 0–75)
Ketone: NEGATIVE
Leukocyte Esterase: NEGATIVE
NITRITE: NEGATIVE
Ph: 6 (ref 4.5–8.0)
Protein: NEGATIVE
SPECIFIC GRAVITY: 1.011 (ref 1.003–1.030)

## 2014-08-18 LAB — CBC WITH DIFFERENTIAL/PLATELET
BASOS PCT: 1.6 %
Basophil #: 0.1 10*3/uL (ref 0.0–0.1)
Eosinophil #: 0.2 10*3/uL (ref 0.0–0.7)
Eosinophil %: 3.3 %
HCT: 33.3 % — AB (ref 40.0–52.0)
HGB: 10.4 g/dL — AB (ref 13.0–18.0)
LYMPHS ABS: 2 10*3/uL (ref 1.0–3.6)
Lymphocyte %: 29.3 %
MCH: 23.4 pg — AB (ref 26.0–34.0)
MCHC: 31.3 g/dL — ABNORMAL LOW (ref 32.0–36.0)
MCV: 75 fL — ABNORMAL LOW (ref 80–100)
MONO ABS: 0.8 x10 3/mm (ref 0.2–1.0)
Monocyte %: 11.5 %
Neutrophil #: 3.7 10*3/uL (ref 1.4–6.5)
Neutrophil %: 54.3 %
Platelet: 565 10*3/uL — ABNORMAL HIGH (ref 150–440)
RBC: 4.46 10*6/uL (ref 4.40–5.90)
RDW: 19.8 % — ABNORMAL HIGH (ref 11.5–14.5)
WBC: 6.8 10*3/uL (ref 3.8–10.6)

## 2014-08-18 LAB — COMPREHENSIVE METABOLIC PANEL
Albumin: 2.3 g/dL — ABNORMAL LOW
Alkaline Phosphatase: 168 U/L — ABNORMAL HIGH
Anion Gap: 6 — ABNORMAL LOW (ref 7–16)
BUN: 24 mg/dL — ABNORMAL HIGH
Bilirubin,Total: 0.9 mg/dL
CALCIUM: 8.3 mg/dL — AB
CO2: 26 mmol/L
Chloride: 107 mmol/L
Creatinine: 1.37 mg/dL — ABNORMAL HIGH
GFR CALC AF AMER: 55 — AB
GFR CALC NON AF AMER: 47 — AB
GLUCOSE: 145 mg/dL — AB
Potassium: 4.7 mmol/L
SGOT(AST): 29 U/L
SGPT (ALT): 21 U/L
Sodium: 139 mmol/L
Total Protein: 8.2 g/dL — ABNORMAL HIGH

## 2014-08-18 LAB — CK TOTAL AND CKMB (NOT AT ARMC)
CK, Total: 104 U/L
CK-MB: 2.4 ng/mL

## 2014-08-18 LAB — TROPONIN I: Troponin-I: <0.03 ng/mL

## 2014-08-21 ENCOUNTER — Other Ambulatory Visit: Admit: 2014-08-21 | Disposition: A | Discharge status: Home / Self Care | Payer: Self-pay

## 2014-08-21 LAB — PROTIME-INR
INR: 1.2
Prothrombin Time: 14.9 secs

## 2014-08-24 ENCOUNTER — Other Ambulatory Visit: Admit: 2014-08-24 | Disposition: A | Discharge status: Home / Self Care | Payer: Self-pay

## 2014-08-24 LAB — PROTIME-INR
INR: 1.2
Prothrombin Time: 15.9 secs — ABNORMAL HIGH

## 2014-08-29 NOTE — Discharge Summary (Signed)
PATIENT NAME:  John Thompson, John MR#:  [id number] DATE OF BIRTH:  [birth year]  DATE OF ADMISSION:  10/14/2013 DATE OF DISCHARGE:  10/19/2013  ADMISSION DIAGNOSIS: Acute on chronic systolic heart failure.   DISCHARGE DIAGNOSES: 1. Acute on chronic systolic heart failure with worsening ejection fraction of 25%.  2. Acute renal failure on chronic kidney disease.  3. Hypertension.  4. Benign prostatic hypertrophy.  5. Atrial fibrillation.  6. Altered mental, likely dementia.   CONSULTATIONS:  1. Dr. Adrian BlackwaterShaukat Khan.  2. Dr. Cherylann RatelLateef.   LABORATORIES AT DISCHARGE: Sodium 139, potassium 4.0, chloride 106, bicarbonate 26, BUN 40, creatinine 2.4, glucose 122. INR is 1.5, hemoglobin 11.   HOSPITAL COURSE: An 81 year old male who presented on 06/09 with shortness of breath and a fall. For further details, please refer to the H and P.  1. Acute on chronic systolic heart failure. The patient was found to have acute on chronic systolic heart failure by elevated BNP and chest x-ray. He is admitted for congestive heart failure exacerbation. Echocardiogram was performed which showed an ejection fraction of 25%. According to Dr. Welton FlakesKhan, the patient has been noncompliant and EF has worsened when compared to his last echo in his office. He was initially on diuresis with Lasix. He has diuresed well, but his creatinine did bump up with a little bit so it seems that he was over diuresed. At this time, we are holding Lasix and he will have daily BMPs once his creatinine is at baseline, which is about 1.8, 1.9 he may restart Lasix 40 mg daily. He will continued on aspirin and Coreg.  2. Acute renal failure on chronic kidney disease with his baseline creatinine from about 1.7 to 1.9. He may have had a component of ATN from low blood pressure and overdiuresis. We are holding losartan at this time.  3. Hypertension. We are holding losartan. His blood pressure is low-normal.  4. Benign prostatic hypertrophy on Flomax and Proscar.  5.  Atrial fibrillation. The patient's heart rate is controlled. His INR is 1.5 at discharge. Will need daily INR checks until goal 2 to 3.  6. Altered mental status. Likely dementia with some personality changes. The patient was started on risperidone.   DISCHARGE MEDICATIONS: 1. Aspirin 81 mg daily.  2. Coumadin 4 mg daily.  3. Flomax 0.4 mg daily.  4. Finasteride 5 mg daily.  5. Humulin R sliding scale insulin.  6. Simvastatin 20 mg at bedtime.  7. Risperdal 0.5 mg b.i.d.  8. Coreg 6.25 b.i.d.  9. Lasix 40 mg daily to be started once creatinine is less than 1.9.    DISCHARGE DIET: ADA low-sodium diet.  DISCHARGE ACTIVITY: As tolerated.  DISCHARGE FOLLOW-UP: The patient to follow up in one week with Dr. Cherylann RatelLateef. The patient should have a BMP daily starting Monday, well as PT/INR once creatinine is less than 1.9 may  restart Lasix 40 mg daily, then check BMP weekly to ensure creatinine is stable. Check INR daily if the patient is on Coumadin. Discharge  INR is 1.5, needs to be between 2 to 3.   The patient will follow up in one week with Dr. Cherylann RatelLateef  and Dr. Hillery AldoSarah Patel.   TIME SPENT: Approximately 35 minutes.  The patient stable for discharge.   ____________________________ Shahzad Thomann P. Juliene PinaMody, MD spm:sg D: 10/19/2013 09:03:58 ET T: 10/19/2013 09:44:16 ET JOB#: [id number]  cc: Devoiry Corriher P. Juliene PinaMody, MD, <Dictator> Munsoor Lizabeth LeydenN. Lateef, MD Sarah "Maryruth HancockSallie" Patel, MD  Janyth ContesSITAL P Matika Bartell MD ELECTRONICALLY SIGNED 10/19/2013 10:34

## 2014-08-29 NOTE — Consult Note (Signed)
   Comments   Josh Borders, NP, and I met with pt's daughter, Joyce. Updated her on pt's current condition. Daughter is pleased with pt's improvement. Daughter was pleased with the care pt has received at [facility] and hopes he can return there at discharge. We discussed the involvement of hospice at ALF and daughter seemed interested in this. We also discussed pt's code status with daughter. She became emotional when talking about end of life issues and requests that pt remain a full code.  expressed appreciation for meeting. All questions answered.   Electronic Signatures: ,  (MD)  (Signed 14-Sep-15 20:22)  Authored: Palliative Care   Last Updated: 14-Sep-15 20:22 by ,  (MD) 

## 2014-08-29 NOTE — H&P (Signed)
PATIENT NAME:  John Thompson, John Thompson MR#:  [id number] DATE OF BIRTH:  1929-03-22  DATE OF ADMISSION:  10/14/2013  REASON FOR ADMISSION: Shortness of breath, status post fall as well.   PRIMARY CARE PHYSICIAN: The patient states he does not have one. He only visits Dr. Adrian Blackwater, cardiologist.  REFERRING PHYSICIAN: Jene Every, MD  HISTORY OF PRESENT ILLNESS: This is an 81 year old gentleman with previous history of non-STEMI, CHF with an ejection fraction of 34% on his last catheterization, also has paroxysmal atrial fibrillation for what he is on Coumadin, benign prostatic hypertrophy and hypertension. He takes metformin for diabetes. The patient comes today with a history of being his normal self, falling yesterday at his house. A neighbor helped him out. He stayed in bed, but this morning he started to have a little bit more of shortness of breath. He was able to walk. He was able to do all his activities. He just was feeling a little bit tender at the level of the right chest wall as he hit one of his ribs whenever he was getting up. The patient states that he hit his head really hard as well, but he did not have any significant changes of mental status. The patient is coming in today because he continues to have some pain at the level of his rib cage and on top of that he has shortness of breath.   REVIEW OF SYSTEMS: The patient is a very poor historian, but he is able to tell me that he is short of breath since this morning. He states that he does not have any other problems. He did not know that he had CHF, but on his records it has been well documented. CONSTITUTIONAL: No fever, fatigue, weight loss or weight gain.  EYES: No blurry vision, double vision.  EARS, NOSE, THROAT: No difficulty swallowing, tinnitus.  RESPIRATORY: No cough, wheezing, or hemoptysis.  CARDIOVASCULAR: No chest pain or orthopnea.  GASTROINTESTINAL: No nausea, vomiting, abdominal pain, constipation, diarrhea.   GENITOURINARY: No dysuria, hematuria, changes in frequency.  ENDOCRINE: No polyuria, polydipsia, polyphagia, cold or heat intolerance.  HEMATOLOGIC AND LYMPHATIC: No anemia, easy bruising or bleeding.  SKIN: No rash or petechiae.  MUSCULOSKELETAL: No significant neck pain, back pain or arthritis.  NEUROLOGIC: No numbness, tingling, or CVA.  PSYCHIATRIC: No insomnia. The patient denies any history of dementia. Actually whenever he is examined, he has very good short-term and long-term memory. He is just peculiar possibly with a very low level of education.   PAST MEDICAL HISTORY: 1.  Hypertension.  2.  BPH. 3.  Paroxysmal atrial fibrillation.  4.  Coronary artery disease.  5.  CHF with ejection fraction of 35%.   PAST SURGICAL HISTORY: Possible biopsy of his prostate. The patient states that he does not know if he had anything done or not.   ALLERGIES: No known drug allergies.   MEDICATIONS: Zocor 20 mg at bedtime, Toprol-XL 50 mg daily, metformin 500 mg twice daily, Lasix 40 mg once a day, Flomax 0.4 mg daily, finasteride 5 mg daily, Cozaar 100 mg daily, Coumadin 4 mg daily, aspirin 81 mg daily.   SOCIAL HISTORY: The patient lives by himself. He cooks. He drives. He gets around. He denies any alcohol abuse. He smokes 5 to 6 cigarettes a day. Smoking cessation counseling given to the patient for over 4 minutes. The patient says that he is not ready to quit smoking. The patient has 3 daughters and he is divorced.  FAMILY HISTORY: Negative  for diabetes, hypertension, myocardial infarction. His father and mother lived into their 10s.   PHYSICAL EXAMINATION: VITAL SIGNS: Blood pressure 182/98, pulse 105, respirations 24, temperature 97.9, oxygen saturation 88% on room air.  GENERAL: The patient is alert and oriented x3, in no acute distress. No respiratory distress. Hemodynamically stable.  HEENT: Pupils are equal and reactive. Extraocular movements are intact. Mucosa is moist. Anicteric  sclerae. Pink conjunctivae. No oral lesions. No oropharyngeal exudate.  NECK: Supple. No JVD. No thyromegaly. No adenopathy. No carotid bruits.  CARDIOVASCULAR: Regular rate and rhythm. No murmurs, rubs, or gallops appreciated. Positive S3. No displacement of PMI. No tenderness to palpation of anterior chest wall.  LUNGS: Showing some crackles in both bases with no use of accessory muscles at this moment. Good air expansion, just diffuse crackles. No use of accessory muscles. No dullness to percussion.  ABDOMEN: Soft, nontender, nondistended. No hepatosplenomegaly. No masses. Bowel sounds are positive.  GENITAL: Deferred.  EXTREMITIES: Positive edema, +1 of the lower extremities. No cyanosis or clubbing.  VASCULAR: Pulses +2. Capillary refill less than 3.  NEUROLOGIC: Cranial nerves II through XII intact. Strength is 5/5 all 4 extremities. No focal findings.  PSYCHIATRIC: No agitation. The patient is alert and oriented x3.  LYMPHATIC: Negative for lymphadenopathy in neck or supraclavicular areas.  MUSCULOSKELETAL: No significant joint effusions or swelling.  SKIN: No rashes or petechiae.   DIAGNOSTIC DATA: BNP 7000. Glucose 125, BUN 19, creatinine 1.93, sodium 141, potassium 3.8, chloride 111, albumin 1.3, bilirubin 1.3. His previous creatinine is somewhere around 1.4 to 1.7 so slightly elevated from previous. His white blood count is 8.4, hemoglobin 13, and platelet count 283,000. His last INR in December 2013 was 2.2, right now I am ordering one stat.  EKG: No ST depression or elevation. Normal sinus rhythm with sinus tachycardia.   Chest x-ray: Pulmonary interstitial and alveolar edema likely secondary to CHF.   ASSESSMENT AND PLAN: An  81 year old gentleman with non-ST elevation myocardial infarction in the past, coronary artery disease, paroxysmal atrial fibrillation, benign prostatic hypertrophy, and congestive heart failure with ejection fraction 35% who comes status post fall and  shortness of breath.  1.  Status post fall. The patient had a fall yesterday night. He came back today because he continued to have pain in the rib cage and on his head. The patient says that he bumped really hard his right side of the head, but he did not have any significant bleeding. The patient is actually on Coumadin for what I am going to order a stat CT scan of the head.  2.  Shortness of breath. The patient started having some shortness of breath today. Chest x-ray shows signs of pulmonary edema and his oxygen saturation was 88%.  3.  Congestive heart failure. The patient has history of systolic dysfunction with ejection fraction of 35%, which seems to be of origin ischemic. The patient had non-ST elevation myocardial infarction in the past. As far as his congestive heart failure, he is in acute exacerbation with alveolar edema. Lasix 10 mg IV q. 12, increase if necessary. Monitor diuresis. Monitor daily weights. Low-salt diet. Continue beta blocker. Continue Cozaar which is an ARB. Obtain a new echocardiogram as it has been over 5 years since his ejection fraction has been addressed or reassessed. Replace potassium as needed and add on potassium twice daily p.o. as the patient is going to be on IV Lasix.  4.  Hypertension. The patient has elevation of his blood pressure to  the 180s/90s. The patient has not had his medications. We are going to restart metoprolol and Cozaar and give him some Lasix.  5.  Diabetes. Hold metformin. The patient has slight elevation of his blood sugar. We are going to put him on insulin sliding scale, low carbohydrate diet, low salt diet. Check hemoglobin A1c. Anticoagulation with Coumadin. We are going to check his INR stat and check it daily.  6.  Paroxysmal atrial fibrillation. At this moment, he is in normal sinus rhythm. Continue beta blocker, continue anticoagulation.  7.  Deep vein thrombosis prophylaxis with Coumadin.  8.  Gastrointestinal prophylaxis with  Protonix.  9.  Chronic kidney disease. His GFR is slightly decreased. This is possibly due to worsening of his known chronic kidney disease versus acute process. We are going to continue to monitor. Continue Lasix for now. I am going to continue the Cozaar for now, but if there is any further worsening we will hold the Cozaar.   TIME SPENT: 45 minutes. ____________________________ Felipa Furnaceoberto Sanchez Gutierrez, MD rsg:sb D: 10/14/2013 15:32:39 ET T: 10/14/2013 17:00:15 ET JOB#: [id number]  cc: Felipa Furnaceoberto Sanchez Gutierrez, MD, <Dictator> Konner Warrior Juanda ChanceSANCHEZ GUTIERRE MD ELECTRONICALLY SIGNED 10/29/2013 11:22

## 2014-08-29 NOTE — Discharge Summary (Signed)
PATIENT NAME:  John Thompson, Rhone MR#:  [id number] DATE OF BIRTH:  Nov 05, 1928  DATE OF ADMISSION:  01/18/2014 DATE OF DISCHARGE:    ADMITTING PHYSICIAN:  Dr. Randol KernElgergawy.    DISCHARGING PHYSICIAN:  Dr. Enid Baasadhika Shafter Jupin.    PRIMARY CARE MEDICAL DOCTOR:  Dr. Adrian BlackwaterShaukat Khan.   CONSULTATIONS IN HOSPITAL: Palliative care consultation by Dr. Harriett SineNancy Phifer.   DISCHARGE DIAGNOSES:  1.  Acute on chronic systolic congestive heart failure exacerbation.  2.  Healthcare-acquired pneumonia.    3.  Dementia with behavioral disturbances.  4.  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  5.  Atrial fibrillation/flutter.  6.  Benign prostatic hypertrophy.  7.  Diabetes mellitus.   DISCHARGE HOME MEDICATIONS:  1.  Finasteride 5 mg p.o. q. daily.  2.  Flomax 0.4 mg p.o. q. daily.  3.  Coreg 6.25 mg p.o. b.i.d.  4.  Risperidone 0.5 mg p.o. b.i.d.  5.  Clorazepate 3.75 mg 1 tablet at bedtime as needed for sleep.  6.  Coumadin 3 mg p.o. q. daily.  7.  Lantus 12 units subcutaneous in the morning.  8.  Lasix 40 mg p.o. q. daily.  9.  Prednisone taper.  10.  Levaquin 250 mg p.o. q. daily.  11.  DuoNebs 3 mL q. 8 hours p.r.n. for wheezing.   HOME OXYGEN:  3 liters.   DISCHARGE DIET: Low-sodium diet with mechanical soft, thin liquids, aspiration precautions.   FOLLOWUP INSTRUCTIONS:  1.  PCP followup in 1 week.  2.  Home hospice services.  3.  Cardiology followup in 1-2 weeks.  4.  Strict fluid restriction up to 1800 mL per day.  5.  INR check in 3 days.  6.  Coumadin to be restarted in 2 days.    LABORATORIES AND IMAGING STUDIES PRIOR TO DISCHARGE:  1.  INR was 4.2.  2.  WBC was 5.9, hemoglobin 11.0, hematocrit 33.6, platelet count 252,000.  3.  Sodium 140, potassium 4.2, chloride 108, bicarbonate 26, BUN 33, creatinine 2.06, glucose of 305, and calcium of 8.7.  4.  Chest x-ray prior to discharge showing patchy infiltrate bilaterally showing some improvement from previous study.   BRIEF HOSPITAL COURSE:  John Thompson is an 81 year old African-American male with past medical history significant for dementia with behavioral disturbances, hypertension, diabetes, systolic CHF, EF of 20%, and CKD, atrial fibrillation, who presents secondary to worsening breathing and also had fevers on admission.   1.  Health-care acquired pneumonia, recently been in rehabilitation so was placed on broad-spectrum antibiotics, once cultures are negative being tapered off and changed over to Levaquin at the time of discharge. Chest x-ray did show some improvement.   2.  Acute on chronic systolic CHF exacerbation. The patient is very noncompliant with his fluid restriction. He is already being diuresed with Lasix. He was placed on strict fluid restriction and started back on Lasix IV, changed over to p.o. at the time of discharge. He is already on carvedilol and not on any ACE inhibitor or ARB due to his chronic kidney disease. He is well compensated at this time and will follow up with Dr. Welton FlakesKhan as an outpatient.  3.  Atrial fibrillation, rate is well controlled. He is on Coreg, he is also on Coumadin. The INR is high because since he has been started on Levaquin. His Coumadin needs dose adjusted and he will have a followup INR in a couple of days.  4.  Diabetes mellitus. He was started back on Lantus with increased dose.  Especially because he is on steroids at this time.   5.  Dementia with behavioral disturbances. He is at baseline, can get impulsive, angry at times.  His course has been otherwise uneventful in the hospital.   DISCHARGE CONDITION: Guarded.   DISCHARGE DISPOSITION:  To assisted living facility with hospice services.   TIME SPENT ON DISCHARGE: 45 minutes.    CODE STATUS:  The patient is a full code.    ____________________________ Enid Baas, MD rk:bu D: 01/22/2014 14:01:20 ET T: 01/22/2014 14:51:35 ET JOB#: [id number]  cc: Enid Baas, MD, <Dictator> Laurier Nancy, MD Enid Baas  MD ELECTRONICALLY SIGNED 02/05/2014 9:50

## 2014-08-29 NOTE — Consult Note (Signed)
PATIENT NAME:  John Thompson, John Thompson DATE OF BIRTH:  01-Sep-1928  DATE OF CONSULTATION:  10/15/2013  REFERRING PHYSICIAN:   CONSULTING PHYSICIAN:  Laurier NancyShaukat A. Khan, MD  INDICATION FOR CONSULT:  Congestive heart failure.   HISTORY OF PRESENT ILLNESS: This is an 81 year old African American male with a past medical history of non-ST-elevation myocardial infarction, congestive heart failure, ejection fraction of 34%, paroxysmal atrial fibrillation on Coumadin, presented to the hospital with severe shortness of breath, PND, orthopnea and apparently fell down. He was not able to walk. The patient is a poor historian but apparently does not follow up in the office after discharge.   PAST MEDICAL HISTORY:  History of hypertension, benign prostatic hypertrophy, paroxysmal atrial fibrillation, coronary artery disease, congestive heart failure with cardiomyopathy, ejection fraction of 35%.   ALLERGIES: None.   MEDICATIONS: Zocor, Toprol-XL 50, metformin, Lasix, Flomax, Coumadin.   SOCIAL HISTORY: He smokes about 5 to 6 cigarettes a day. Denies EtOH abuse.   FAMILY HISTORY: Positive for hypertension and diabetes.   PHYSICAL EXAMINATION: VITAL SIGNS:  He was hypertensive with blood pressure 182/98, pulse 105, respirations 24 when he came in. He was afebrile.  NECK: Positive JVD.  LUNGS: Good air entry. No rales.  HEART: Regular rate and rhythm. Normal S1, S2. No audible murmur.  ABDOMEN: Soft, nontender, positive bowel sounds.  EXTREMITIES: No pedal edema.   LABORATORY, DIAGNOSTIC AND RADIOLOGICAL DATA:  EKG shows sinus tachycardia, 105 beats per minute, left atrial enlargement, left ventricular hypertrophy, nonspecific ST-T changes. His INR was 2.2, BNP was 7000. BUN and creatinine was 19/1.93. Hemoglobin was 8.4. Chest x-ray showed pulmonary interstitial alveolar edema.   ASSESSMENT AND PLAN: The patient has congestive heart failure, decompensated, with history of severe left ventricular  dysfunction, status post fall. He has history of hypertensive heart disease. He is noncompliant sometimes with his followup in the office. I advise continuation of Lasix IV, aspirin, losartan and metoprolol, and continue Coumadin. We will get an echocardiogram to further evaluate his ejection fraction right now and make further recommendations based on the ejection fraction.     Thank you very much for the referral.    ____________________________ Laurier NancyShaukat A. Khan, MD sak:cs D: 10/15/2013 14:10:19 ET T: 10/15/2013 14:53:28 ET JOB#: [id number]  cc: Laurier NancyShaukat A. Khan, MD, <Dictator> Laurier NancySHAUKAT A KHAN MD ELECTRONICALLY SIGNED 11/19/2013 9:20

## 2014-08-29 NOTE — Discharge Summary (Signed)
PATIENT NAME:  John Thompson, John Thompson MR#:  [id number] DATE OF BIRTH:  [birth year]  DATE OF ADMISSION:  12/12/2013 DATE OF DISCHARGE:  12/14/2013  ADMITTING PHYSICIAN: Elby Showers, MD  DISCHARGING PHYSICIAN: Enid Baas, MD  PRIMARY CARE PHYSICIAN: None.   CONSULTATIONS IN THE HOSPITAL: None.  PRIMARY CARDIOLOGIST: Dr. Adrian Blackwater   DISCHARGE DIAGNOSES: 1.  Acute on chronic systolic congestive heart failure exacerbation.  2.  Dementia with behavioral disturbances.  3.  Atrial fibrillation, on Coumadin.  4.  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  5.  Benign prostatic hypertrophy.   DISCHARGE HOME MEDICATIONS:  1.  Coumadin 4 mg p.o. every evening.  2.  Flomax 0.4 mg p.o. daily.  3.  Finasteride 5 mg p.o. daily.  4.  Risperdal 0.5 mg p.o. b.i.d.  5.  Carvedilol 6.25 mg p.o. b.i.d.  6.  Atorvastatin 10 mg p.o. at bedtime.  7.  Januvia 50 mg p.o. daily.  8.  Lasix 40 mg p.o. daily.  9.  Glipizide 2.5 mg p.o. daily.   DISCHARGE DIET: Low-sodium.  DISCHARGE ACTIVITY: As tolerated.    FOLLOWUP INSTRUCTIONS: 1.  PCP follow-up in 1 week.  2.  Physical therapy.  3.  PT-INR check in 3 days.   DIAGNOSTIC DATA: Labs and imaging studies prior to discharge: Chest x-ray on December 15, 2013 showing mild CHF pattern, which is stable. New right upper lobe atelectasis noted.   INR is 2.6. WBC is 5.5, hemoglobin 12.6, hematocrit 38.8 and platelet count 185,000.   Sodium 142, potassium 3.7, chloride 107, bicarb 28, BUN 34, creatinine 2.1, glucose 137.  BRIEF HOSPITAL COURSE: John Thompson is an 81 year old African American male with past medical history of significant for CKD, dementia with behavioral disturbances, congestive heart failure with systolic dysfunction and EF of 25% based on echo from June 2015, and atrial fibrillation, on Coumadin, who presents from home secondary to chest pressure and noted to have mild CHF exacerbation.  1.  Acute on chronic  systolic CHF exacerbation. The patient was started on IV Lasix and his breathing has improved. His sats are 99% on room air. His chest x-ray shows improvement, but still has some mild CHF pattern. He is being discharged on Lasix p.o. daily. He will follow up with Dr. Welton Flakes as an outpatient.  2.  CKD stage III to IV, baseline creatinine around 1.8 to 2. While on diuretics, creatinine has been around 2.1. He will have a follow up with his PCP. Avoid nephrotoxins, which is why he is not on any ACE inhibitor or aldosterone antagonist at this time.  3.  A-fib, on Coumadin. Rate controlled. He is on Coreg. INR is 2.6, therapeutic.  4.  Hypertension. Continue home medications.  5.  Benign prostatic hypertrophy. Continue home medications.  6.  Diabetes mellitus. The patient on Januvia and glipizide. Dose adjusted here in the hospital.  7.  Dementia with behavioral disturbances. The patient is at times very agitated, impulsive. Does not like to be told. He is on risperidone b.i.d., which will be continued.   He worked with physical therapy who recommended rehab and family, because of his dementia and since he is not safe to be living at home by himself, are pursuing long time care at this time.   DISCHARGE CONDITION: Guarded with poor long-term prognosis.   DISCHARGE DISPOSITION: To rehab facility.   TIME SPENT ON DISCHARGE: 45 minutes. ____________________________ Enid Baas, MD rk:sb D: 12/15/2013 11:37:00 ET T: 12/15/2013 12:21:50 ET JOB#:  [id number]  cc: Enid Baasadhika Jessenya Berdan, MD, <Dictator> Laurier NancyShaukat A. Khan, MD Enid BaasADHIKA Damonie Ellenwood MD ELECTRONICALLY SIGNED 12/23/2013 14:27

## 2014-08-29 NOTE — H&P (Signed)
PATIENT NAME:  John Thompson, John Thompson MR#:  [id number] DATE OF BIRTH:  October 08, 1928  DATE OF ADMISSION:  01/18/2014  REFERRING PHYSICIAN:  Loraine LericheMark R. Fanny BienQuale, MD  PRIMARY CARDIOLOGIST:  Laurier NancyShaukat A. Khan, MD  PRIMARY CARE PHYSICIAN:  Unknown.  CHIEF COMPLAINT:  Dysuria, shortness of breath.   HISTORY OF PRESENT ILLNESS:  This is an 81 year old male who presents from the nursing home today due to complaints of dysuria. As well, the patient has been complaining of shortness of breath and was found to be febrile as well. The patient's main concern was dysuria when he came. His urinalysis was negative, but the patient was found to have 500 mL postvoid on bladder scan so a Foley catheter was inserted. As well, the patient was febrile at 102 in the ED. Chest x-ray did show pneumonia so he was started on broad-spectrum IV antibiotics as he is in subacute rehabilitation, as well as known to have a history of CHF, has worsening lower extremity edema. Chest x-ray showing vascular congestion picture as well. The patient is known to have a history of atrial fibrillation. He is on anticoagulation with therapeutic INR. The patient was found to be in atrial fibrillation with RVR, but this has improved after he had the Foley catheter inserted, as well he is known to have a history of congestive heart failure, baseline creatinine around 2, which remains to be stable at this point. The patient denies any chest pain, any nausea, any vomiting, any diarrhea, or any constipation. Given his multiple complaints, hospitalist requested to admit the patient for management and treatment of his symptoms.   PAST MEDICAL HISTORY:  1.  Congestive heart failure, EF 25%.  2.  Chronic kidney disease, baseline creatinine 1.8.  3.  Hypertension.  4.  Atrial fibrillation, on warfarin.  5.  BPH.  6.  Dementia.  7.  Coronary artery disease.  8.  Diabetes mellitus.   ALLERGIES: No known drug allergies.   SOCIAL HISTORY: The patient used to live by  himself at baseline, but he was recently discharged from [facility]. Since then, he has been a nursing home resident. No smoking. No alcohol. No illicit drug use.   FAMILY HISTORY: Significant for both parents lived to their 4390s. Negative for diabetes, hypertension, coronary artery disease.   REVIEW OF SYSTEMS:  CONSTITUTIONAL: The patient is mildly confused, pleasant. Review of system was obtained to my best. Denies weight gain or weight loss. Reports poor appetite, chills, fatigue, weakness.  EYES: Denies blurry vision, double vision, inflammation.  EARS, NOSE, THROAT: Denies tinnitus, ear pain, hearing loss, epistaxis.  RESPIRATORY: Reports cough, productive sputum. Denies hemoptysis.  CARDIOVASCULAR: Denies chest pain. He has worsening lower extremity edema. Denies palpitations.  GASTROINTESTINAL: Denies nausea, vomiting, diarrhea, abdominal pain.  GENITOURINARY: Reports dysuria. Denies hematuria or renal colic.  ENDOCRINE: Denies polyuria, polydipsia, heat or cold intolerance.  HEMATOLOGIC: Denies anemia, easy bruising, bleeding diathesis.  INTEGUMENT: Denies acne, rash, or skin lesions.  MUSCULOSKELETAL: Denies any gout or cramps.  NEUROLOGIC: Denies history of CVA, TIA, vertigo, ataxia. He has dementia.  PSYCHIATRIC: Denies anxiety, insomnia, or depression. The patient is very confused and this review of systems was obtained with the assistance of his daughters  at the bedside.   HOME MEDICATIONS:  1.  Finasteride 5 mg daily.  2.  Tamsulosin 0.4 mg daily.  3.  Warfarin 4 mg daily.  4.  Lantus 6 units daily.  5.  Promethazine 12.5 mg 3 times a day.  6.  Risperidone  0.5 mg 2 times a day.  7.  Clorazepate 3.75 mg oral at bedtime.  8.  Coreg 6.25 mg 2 times a day.   PHYSICAL EXAMINATION:  VITAL SIGNS: Temperature 102, pulse 98, respiratory rate 16, blood pressure 140/96, satting 96% on room air.  GENERAL: Well-nourished male who looks comfortable in bed, in no apparent  distress.  HEENT: Head atraumatic, normocephalic. Pupils equal, reactive to light. Pink conjunctivae.  Clear sclerae. Dry oral mucosa. No oral thrush. No nasal discharge.  NECK: Supple. No thyromegaly. No JVD.  CHEST: The patient has good air entry bilaterally with scattered rales at the bases. No wheezing.  CARDIOVASCULAR: S1, S2 heard. No rubs, murmurs, or gallops. Irregular, tachycardic.  ABDOMEN: Soft. Mild distention with bowel sounds present. No rebound, no guarding. He had bladder distention felt and this was before the Foley catheter was inserted.  EXTREMITIES: Edema 2+ bilaterally. No clubbing, no cyanosis. Pedal pulses felt.  PSYCHIATRIC: The patient is awake, conversant, but confused, alert to name and place. He knows he is in the hospital, but cannot recall the hospital's name.  NEUROLOGIC: Cranial nerves grossly intact. No focal deficits. Symmetrical sensation to light touch.  MUSCULOSKELETAL: No joint effusion or erythema.  SKIN: Normal skin turgor. Warm and dry.   PERTINENT LABORATORY DATA:  Glucose 185, BNP 10,541, BUN 30, creatinine 2.16, sodium 145, potassium 3.9, chloride 115. Troponin 0.03. White blood cells 8.4, hemoglobin 10.7, hematocrit 32.4, platelets 202,000. Chest x-ray: Patchy bilateral airspace opacities most prominent at the left lung base. May reflect multifocal pneumonia or pulmonary edema. Underlying vascular congestion noted.   ASSESSMENT AND PLAN:  1.  Pneumonia: The patient presents with shortness of breath, cough, fever. Chest x-ray suspicious for pneumonia. He will be treated for healthcare-acquired pneumonia. He was started on vancomycin and Zosyn.  2.  Urinary retention: The patient is known to have benign prostatic hypertrophy, He had a Foley catheter inserted. Continue with finasteride  and tamsulosin.  3.  Congestive heart failure: The patient is known to have a history of systolic congestive heart failure. At this point, he appears to be in acute systolic  congestive heart failure. We will give him one dose of Lasix in the ED and then we will assess in the morning to see if should resume or not given his frail condition and renal disease.  4.  Chronic kidney disease ,slightly worsen: The  patient at this time needs diuresis. We will give one dose of diuresis and we will monitor closely.  5.  Hypertension, blood pressure acceptable: Continue to monitor.  6.  Atrial fibrillation: The patient's rate control improved after his bladder was drained. We will consult cardiology regarding his warfarin. Continue with beta blockers.  7.  Benign prostatic hypertrophy: Continue with Flomax and finasteride.  8.  Diabetes mellitus: The patient is on insulin sliding-scale.  9.  Deep vein thrombosis prophylaxis: The patient is anticoagulated with warfarin and has on sequential compression devices.   CODE STATUS:  Full code. Daughter is healthcare power of attorney. The patient does not have a living will.   TOTAL TIME SPENT ON ADMISSION AND PATIENT CARE:  55 minutes.    ____________________________ Starleen Arms, MD dse:ts D: 01/19/2014 01:43:04 ET T: 01/19/2014 03:52:32 ET JOB#: [id number]  cc: Starleen Arms, MD, <Dictator> Tomio Kirk Teena Irani MD ELECTRONICALLY SIGNED 01/20/2014 7:07

## 2014-08-29 NOTE — H&P (Signed)
PATIENT NAME:  John Thompson, John Thompson MR#:  [id number] DATE OF BIRTH:  Aug 29, 1928  DATE OF ADMISSION:  12/12/2013  PRIMARY CARE PROVIDER: Unknown, the patient states that he does not have one.    PRIMARY CARDIOLOGIST: Dr. Welton FlakesKhan.   CHIEF COMPLAINT: Acute onset shortness of breath and confusion.   HISTORY OF PRESENT ILLNESS: This very pleasant 81 year old man was at home today with his nursing aide when she noted that he was slightly confused, not answering questions, and seemed to be having difficulty breathing. The episode lasted somewhere from 10 to 15 minutes, after which he continued to be short of breath. EMS was called at that point. The history is provided by his ex-wife, who is present at the time of interview. The patient is a poor historian. The ex-wife reports that for the past few days he has been having increasing swelling in his lower extremities and also in his abdomen. She states that he has been increasingly confused. At baseline, he is oriented to place and people though not to specific details, often becoming confused in conversation.   PAST MEDICAL HISTORY:  1.  Congestive heart failure with systolic ejection fraction of 04%25% shown on 2D echocardiogram in June of this year. Most recent hospitalization for CHF exacerbation June 9  through 14 of this year.  2.  Chronic kidney disease with a baseline creatinine of about 1.8.  3.  Hypertension.  4.  Atrial fibrillation, on Coumadin.  5.  Benign prostatic hypertrophy.  6.  Dementia.  7.  Coronary artery disease.  8.  Diabetes mellitus.   HOME MEDICATIONS: Not yet verified. Family will bring medication list in the morning. This list is from his most recent discharge summary.  1.  Aspirin 81 mg daily.  2.  Coumadin 4 mg daily.  3.  Flomax 0.4 mg daily.  4.  Finasteride 5 mg daily.  5.  Humulin-R per sliding scale.  6.  Simvastatin 20 mg at bedtime.  7.  Risperdal 0.5 mg b.i.d.  8.  Coreg 6.25 mg b.i.d.  9.  Lasix 40 mg daily, this was to  be started once creatinine had decreased to less than 1.9. It is unclear whether this medication was restarted after discharge. Family is bringing medication list in the morning.   ALLERGIES: The patient has no known allergies.   SOCIAL HISTORY: The patient lives by himself and performs his own activities of daily living. He is cared for by his daughters and also by a CNA. He denies alcohol use. He states that he smokes cigarettes only occasionally. Denies any illicit substance abuse.   FAMILY MEDICAL HISTORY: Negative for diabetes, hypertension, coronary artery disease. Both of his parents lived into their 6690s.   REVIEW OF SYSTEMS: The patient is unable to perform the review of systems due to confusion at this time.   PHYSICAL EXAMINATION:  VITAL SIGNS: Temperature 98.3, pulse 88, respirations 16, systolic blood pressure 128, diastolic 76, pulse oximetry 97% on 2 liters nasal cannula.  GENERAL: The patient is alert, lying comfortably in the bed. He states that his feet are cold but otherwise has no acute complaints.  HEENT: Pupils are equal, round, and reactive. Conjunctivae are clear. Oral mucosa slightly dry. Poor dentition. Oropharynx is clear with no exudates or ulcers. Trachea is midline.  NECK: Supple no lymphadenopathy.  PULMONARY: There are bibasilar crackles on examination. Good air movement, no respiratory distress at the time of examination.  CARDIOVASCULAR: Irregularly irregular, rate controlled. No murmurs, rubs, or gallops. Peripheral  pulses are 1+, there is 2+ pitting edema on the left leg, 1+ on the right.  ABDOMEN: Distended, tympanic, soft, nontender, no guarding, no rebound, no mass.  MUSCULOSKELETAL: There are no swollen or tender joints. The patient is able to move all 4 extremities without difficulty.  CRANIAL NERVES: He does not participate in the neurologic examination. Cranial nerves II through XII are grossly intact, and as stated above he moves all 4 extremities.    LABORATORIES: BNP is 4875. Cardiac enzymes are negative. Sodium is 141, potassium 3.6, chloride 105, bicarbonate 27, BUN is 27, creatinine 2.02, glucose 123. LFTs are normal. White blood cell count normal at 6.5, hemoglobin 12.7, platelets 195. MCV is 80. INR is 3.1. Urinalysis is negative for signs of infection. CT of the head without contrast shows no acute intracranial abnormality. 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 Chest x-ray shows cardiac enlargement with vascular congestion and interstitial edema, mild. Abdominal x-ray shows moderate stool throughout the colon to the rectum, also air filled small bowel loops suggesting possible constipation and ileus. No obstruction.   ASSESSMENT AND PLAN: 1.  Congestive heart failure exacerbation in patient with an ejection fraction of 25%.  BNP is elevated. Patient has pulmonary edema on chest x-ray and lower extremity edema. He has being given IV Lasix. Oral Lasix will be restarted. Strict I's and O's. Low sodium diet. Monitor for improved respiratory status. Continue aspirin and carvedilol. Note that this patient is not currently on an ACE inhibitor likely due to decreased renal function.  2.  Possible ileus versus constipation. Start MiraLax b.i.d.  3.  Supratherapeutic INR. Will resume Coumadin dosing per pharmacy.  4.  Chronic kidney disease stage IV with a baseline creatinine of 1.8. Currently with a congestive heart failure exacerbation and signs of volume overload, so Lasix dosing is necessary. We will monitor creatinine closely. At 2.02 he is close to baseline.  5.  Atrial fibrillation. Currently rate is controlled. Continue Coumadin.  6.  Dementia. Altered mental status over the past few days likely due to congestive heart failure exacerbation. The patient's ex-wife is present and states that this is close to his baseline.   7.  Hypertension. Currently blood pressure is controlled. Continue with carvedilol and Lasix.  8.  Diabetes mellitus. Check a hemoglobin A1c and start sliding scale.  9.  Benign prostatic hypertrophy. Continue with finasteride and tamsulosin.   TIME SPENT DURING THIS ADMISSION: 45 minutes.    ____________________________ Ena Dawley. Clent Ridges, MD cpw:at D: 12/12/2013 20:33:57 ET T: 12/12/2013 20:57:34 ET JOB#: [id number]  cc: Ena Dawley. Clent Ridges, MD, <Dictator> Gale Journey MD ELECTRONICALLY SIGNED 12/20/2013 13:55

## 2014-08-29 NOTE — Op Note (Signed)
PATIENT NAME:  John NortonWOODS, Michaela MR#:  [id number] DATE OF BIRTH:  1928/07/04  DATE OF PROCEDURE:  10/14/2013  PREOPERATIVE DIAGNOSES: 1.  Acute on chronic systolic congestive heart failure.  2.  Hypertension.  3.  Atrial fibrillation.     POSTOPERATIVE DIAGNOSES:  1.  Acute on chronic systolic congestive heart failure.  2.  Hypertension.  3.  Atrial fibrillation.   PROCEDURES:  1. Ultrasound guidance for vascular access to right brachial vein.  2. Fluoroscopic guidance for placement of catheter.  3. Insertion of peripherally inserted central venous catheter, double lumen, right arm.  SURGEON: Festus BarrenJason Alyne Martinson, MD  ANESTHESIA: Local.   ESTIMATED BLOOD LOSS: Minimal.   INDICATION FOR PROCEDURE: This is an 81 year old male who we are asked to place a PICC line for durable venous access. He is admitted for acute on chronic congestive heart failure and multiple other issues, but has poor venous access.   DESCRIPTION OF PROCEDURE: The patient's right arm was sterilely prepped and draped, and a sterile surgical field was created. The right brachial vein was accessed under direct ultrasound guidance without difficulty with a micropuncture needle and permanent image was recorded. 0.018 wire was then placed into the superior vena cava. Peel-away sheath was placed over the wire. A single lumen peripherally inserted central venous catheter was then placed over the wire and the wire and peel-away sheath were removed. The catheter tip was placed into the superior vena cava and was secured at the skin at 38 cm with a sterile dressing. The catheter withdrew blood well and flushed easily with heparinized saline. The patient tolerated procedure well.     ____________________________ Annice NeedyJason S. Vona Whiters, MD jsd:sb D: 10/17/2013 12:14:00 ET T: 10/17/2013 12:19:08 ET JOB#: [id number]  cc: Annice NeedyJason S. Jerelene Salaam, MD, <Dictator> Annice NeedyJASON S Shacarra Choe MD ELECTRONICALLY SIGNED 10/23/2013 10:53

## 2014-08-30 NOTE — Op Note (Signed)
PATIENT NAME:  John Thompson, John Thompson MR#:  [id number] DATE OF BIRTH:  1929/05/07  DATE OF PROCEDURE:  06/27/2011  PREOPERATIVE DIAGNOSIS: Visually significant cataract of the left eye.   POSTOPERATIVE DIAGNOSIS: Visually significant cataract of the left eye.   OPERATIVE PROCEDURE: Cataract extraction by phacoemulsification with implant of intraocular lens to left eye.   SURGEON: Galen ManilaWilliam Huma Imhoff, MD.   ANESTHESIA:  1. Managed anesthesia care.  2. Topical tetracaine drops followed by 2% Xylocaine jelly applied in the preoperative holding area.   COMPLICATIONS: None.   TECHNIQUE:  Stop and chop.  DESCRIPTION OF PROCEDURE: The patient was examined and consented in the preoperative holding area where the aforementioned topical anesthesia was applied to the left eye and then brought back to the Operating Room where the left eye was prepped and draped in the usual sterile ophthalmic fashion and a lid speculum was placed. A paracentesis was created with the side port blade and the anterior chamber was filled with viscoelastic. A near clear corneal incision was performed with the steel keratome. A continuous curvilinear capsulorrhexis was performed with a cystotome followed by the capsulorrhexis forceps. Hydrodissection and hydrodelineation were carried out with BSS on a blunt cannula. The lens was removed in a stop and chop technique and the remaining cortical material was removed with the irrigation-aspiration handpiece. The capsular bag was inflated with viscoelastic and the Tecnis ZCB00 21.0-diopter lens, serial number [id number] was placed in the capsular bag without complication. The remaining viscoelastic was removed from the eye with the irrigation-aspiration handpiece. The wounds were hydrated. The anterior chamber was flushed with Miostat and the eye was inflated to physiologic pressure. The wounds were found to be water tight. The eye was dressed with Vigamox and Omnipred. The patient was given  protective glasses to wear throughout the day and a shield with which to sleep tonight. The patient was also given drops with which to begin a drop regimen today and will follow-up with me in one day.  ____________________________ Jerilee FieldWilliam L. Moneka Mcquinn, MD wlp:slb D: 06/27/2011 12:19:01 ET T: 06/27/2011 12:25:16 ET JOB#: [id number]  cc: Babbie Dondlinger L. Timesha Cervantez, MD, <Dictator> Jerilee FieldWILLIAM L Troyce Gieske MD ELECTRONICALLY SIGNED 06/30/2011 15:32

## 2014-08-31 ENCOUNTER — Other Ambulatory Visit: Admit: 2014-08-31 | Disposition: A | Discharge status: Home / Self Care | Payer: Self-pay

## 2014-08-31 LAB — PROTIME-INR
INR: 1.2
Prothrombin Time: 15.6 secs — ABNORMAL HIGH

## 2014-09-06 NOTE — Discharge Summary (Signed)
Dates of Admission and Diagnosis:  Date of Admission 03-Aug-2014   Date of Discharge 06-Aug-2014   Admitting Diagnosis Confusion   Final Diagnosis 1. Acute right cerebellar Cerebrovascular accident 2. Acute encephalopathy over dementia 3. left pneumonia 4. Scrotal pressure ulcer 5. coronary artery disease  6. Chronic systolic chf 7. DIABETES MELLITUS  8. hypertension  9. Arthritis with right knee pain    Chief Complaint/History of Present Illness PRIMARY CARE PHYSICIAN:  Dr. Ellsworth Lennox.    CHIEF COMPLAINT:  Decreased p.o. intake and also change in mental status.   HISTORY OF PRESENT ILLNESS: This is an 81 year old African-American male with a history of CVA, hypertension, chronic atrial fibrillation, type 2 diabetes mellitus, CKD, BPH, Alzheimer dementia, brought in by the family because of his change in mental status. The patient has been weak since Saturday and family thinks he had a mini stroke on Saturday because he was having some slurred speech and refusing to get up from bed and also having some confusion. The patient has baseline dementia, but according to family he is more confused than usual and also decreased p.o. intake and change in mental status. Concerning this they thought the patient may have mini stroke. The patient's CT head is unremarkable here. The patient noted to have some dementia, was noted to have some pneumonia and also some scrotal cellulitis. The patient has been started on Xanax for his agitation and also amitriptyline. The patient's amitriptyline started at 10 mg since yesterday and the patient's family especially the daughter says she is not aware of that and she found her dad very sleepy yesterday and today and she is really concerned about that. The patient had no fever. No cough, but in the ER the patient noted to have some apnea spells. Concerning that the ER doctor wanted me to admit.   Allergies:  No Known Allergies:   Pertinent Past History:   Pertinent Past History PAST MEDICAL HISTORY:  1.            chronic systolic heart failure, EF of 13%.   2. History of chronic atrial fibrillation.  3. Coronary artery disease.  4. Hypertension.  5. Diabetes.   6. CKD stage III/  7. BPH.   8. Type 2 diabetes mellitus.   Hospital Course:  Hospital Course 80 m with h/o Cerebrovascular accident, dementia, Afib on coumadin, Chronic kdney disease 3, hypertension, Arthritis here with confusion  * Acute right cerebellar Cerebrovascular accident Coumadin, Statin. Not a candidate for tPA. >24 symptoms. Consulted neurology. No change. PT/OT/Speech  * Acute encephalopathy over dementia - Multifactorial Due to Cerebrovascular accident, Infection  * Atrial fibrillation. Continue warfarin and carvedilol.  * Hypernatremia Improved  * Scrotal pressure sore. Urology consulted Duoderm dressing  * Left Pneumonia On abx Change to levaquin  * Hypertension, currently controlled.   * Coronary artery disease, stable.   * Diabetes mellitus type 2. SL, lantus.  * chronic systolic Congestive heart failure,  EF25%. cont  lisinopril and aldactone.  * chronic respiratory failure with hypoxia: cont O2 [place], neb. po lasix.  * Chronic kidney disease, stage III.   * BPH:  Continue finasteride and tamsulosin.  Time spent on discharge 40 minutes   Condition on Discharge Guarded   Code Status:  Code Status No Code/Do Not Resuscitate   PHYSICAL EXAM ON DISCHARGE:  Physical Exam:  GEN no acute distress   HEENT pale conjunctivae   NECK No masses   RESP normal resp effort  clear BS  CARD irregular rate  murmur present   ABD denies tenderness   Additional Comments Swelling right knee- improved   DISCHARGE INSTRUCTIONS HOME MEDS:  Medication Reconciliation: Patient's Home Medications at Discharge:     Medication Instructions  finasteride 5 mg oral tablet  1 tab(s) orally once a day   tamsulosin 0.4 mg oral capsule  1 cap(s) orally  once a day   carvedilol 6.25 mg oral tablet  1 tab(s) orally 2 times a day   lantus 100 units/ml subcutaneous solution  12 unit(s) subcutaneous once a day (in the morning)   omeprazole 40 mg oral delayed release capsule  1 cap(s) orally once a day   tylenol 500 mg oral tablet  2 tab(s) orally every 4 hours, As Needed - for Pain   miralax - oral powder for reconstitution  17 gram(s) orally every other day (at bedtime)   simvastatin 40 mg oral tablet  1 tab(s) orally once a day (at bedtime)   warfarin 3 mg oral tablet  0.5 tab(s) orally once a day   eucerin - topical cream  Apply topically to affected area 2 times a day   furosemide 20 mg oral tablet  1 tab(s) orally once a day (in the morning)   alprazolam 0.5 mg oral tablet  1 tab(s) orally every 12 hours, As Needed - for Agitation, for Inability to Sleep    albuterol-ipratropium 2.5 mg-0.5 mg/3 ml inhalation solution  3 milliliter(s) inhaled every 6 hours, As Needed - for Wheezing, for Shortness of Breath    novolog 100 units/ml subcutaneous solution  15 unit(s) subcutaneous 3 times a day (before meals), As Needed per sliding scale:    0 units  3 units 4 units 6 units 8 units 10 units 12 unitsthan 400:  15 units    spironolactone 25 mg oral tablet  0.5 tab(s) orally once a day   montelukast 10 mg oral tablet  1 tab(s) orally once a day (at bedtime)   albuterol-ipratropium 2.5 mg-0.5 mg/3 ml inhalation solution  3 milliliter(s) inhaled 2 times a day   lisinopril 5 mg oral tablet  0.5 tab(s) orally once a day   levaquin 500 mg oral tablet  1 tab(s) orally once a day - 7 days    STOP TAKING THE FOLLOWING MEDICATION(S):    amitriptyline 10 mg oral tablet: 1 tab(s) orally once a day (at bedtime)  Physician's Instructions:  Dressing Care Duoderm to scrotal pressure area   Diet Low Sodium  Carbohydrate Controlled (ADA) Diet   Activity Limitations As tolerated   Return to Work Not Applicable   Time frame for Follow Up Appointment 1-2 days   Physician at Rehab   Other Comments Check INR in 2 days. Hold coumadin till INR check. Can resume coumadin if INR between 2-3.   Electronic Signatures: Eilidh Marcano, Andreas BlowerSrikar Reddy (MD)  (Signed 31-Mar-16 12:39)  Authored: ADMISSION DATE AND DIAGNOSIS, CHIEF COMPLAINT/HPI, Allergies, PERTINENT PAST HISTORY, HOSPITAL COURSE, PHYSICAL EXAM ON DISCHARGE, DISCHARGE INSTRUCTIONS HOME MEDS, PATIENT INSTRUCTIONS   Last Updated: 31-Mar-16 12:39 by Minette HeadlandSudini, Alania Overholt Reddy (MD)

## 2014-09-06 NOTE — H&P (Signed)
PATIENT NAME:  John NortonWOODS, [name] MR#:  [id number] DATE OF BIRTH:  04-13-1929  DATE OF ADMISSION:  07/08/2014  REFERRING PHYSICIAN: Enedina Finnerandolph N. Manson PasseyBrown, MD  PRIMARY CARE PHYSICIAN: Silas FloodSheikh A. Ellsworth Lennoxejan-Sie, MD  ADMISSION DIAGNOSIS: Cerebrovascular accident.   HISTORY OF PRESENT ILLNESS: This is an 81 year old African American male who presented to the Emergency Department complaining of hurting all over. Initially his daughter was at the bedside and states that he developed some right lower extremity weakness as well as a right facial droop. She states this occurred approximately 7 hours prior to admission. The patient states that he felt twice recently but denies any head trauma. He has some dysarthria on physical examination and admits to coughing for the last 2 weeks. He denies any chest pain or shortness of breath. In the Emergency Department, head CT did not reveal any areas of ischemia and CTA of the chest ruled pulmonary embolism or infiltrate. However, due to his persistent weakness and facial droop, the Emergency Department called for neurological evaluation and admission.   REVIEW OF SYSTEMS: The patient is not the best historian but he denies recent fevers. He admits to cough that is productive of white phlegm, but he denies any chest pain, nausea, vomiting, or abdominal pain. He constantly states that he has to urinate, which we attribute to his benign prosthetic hypertrophy. He also has dementia, which makes gathering a complete review of systems difficult and the patient has been uncooperative with staff and slightly verbally abusive or belligerent, thus his review of systems is incomplete.   PAST MEDICAL HISTORY: Atrial fibrillation, hypertension, coronary artery disease, diabetes type 2, congestive heart failure, chronic kidney disease, benign prostatic hypertrophy, and dementia.   PAST SURGICAL HISTORY: Unavailable, as the patient cannot contribute to his medical record.   SOCIAL HISTORY: He  reports that he lives by himself, but previous documentation states that the patient recently moved to a nursing home. He states he drinks beer, which is highly unlikely if he is in a facility, but he denies drug use or smoking.   FAMILY HISTORY: Also, unavailable at this time, as the patient cannot contribute to his own history.   MEDICATIONS:  1.  Albuterol with ipratropium 2.5 mg/0.5 mg/3 mL 1 nebulizer treatment every 8 hours as needed for coughing, wheezing, or shortness of breath.  2.  Amitriptyline 10 mg 1 tablet p.o. at bedtime.  3.  Carvedilol 6.25 mg 1 tablet p.o. b.i.d.  4.  Coumadin 3 mg 1 tablet p.o. at bedtime.  5.  Finasteride 5 mg 1 tablet p.o. daily.  6.  Furosemide 40 mg 1 tablet p.o. every morning.  7.  Lantus 12 units subcutaneously every morning.  8.  MiraLax 17 grams reconstitution every other day at bedtime.  9.  Montelukast 10 mg 1 tablet p.o. every evening.  10.  NovoLog per sliding scale 3 times a day.  11.  Omeprazole 40 mg 1 capsule p.o. daily.  12.  Tamsulosin 0.4 mg 1 capsule p.o. daily.  13.  Tylenol 500 mg 2 tablets p.o. every 4 hours as needed for pain.   ALLERGIES: No known drug allergies.   PERTINENT LABORATORY RESULTS AND RADIOGRAPHIC FINDINGS: Serum glucose is 113, BUN is 24, creatinine 1.85, serum sodium is 143, potassium 3.5, serum chloride is 110, bicarbonate 26, calcium 7.8. Serum albumin is 2.4, alkaline phosphatase is 183, AST is 37, ALT is 24. Troponin is 0.04. White blood cell count is 6, hemoglobin is 10.7, hematocrit 32.5, platelet count 238,000, MCV is  78. INR is 2.1. Urinalysis shows 30+ protein and 1+ blood, but is negative for infection. CT of the head shows no acute intracranial findings and there is stable brain atrophy and small vessel ischemia. A 2-view chest film shows a CHF pattern and COPD.   PHYSICAL EXAMINATION:  VITAL SIGNS: Temperature is 97.5, pulse 109, respirations 20, blood pressure is 129/74, pulse oximetry is 92% on 2 L  oxygen via nasal cannula.  GENERAL: The patient is alert and oriented x 3. He is in no apparent distress on my physical examination.  HEENT: Was normocephalic, atraumatic. Pupils equal, round, and reactive to light. Extraocular movements are intact. Mucous membranes are moist. The patient does have a right-sided facial droop.  NECK: Trachea is midline. No adenopathy. Thyroid is nonpalpable and nontender.  CHEST: Symmetric and atraumatic.  CARDIOVASCULAR: Regular rate and rhythm. Normal S1, S2. No rubs, clicks, or murmurs appreciated. The patient has no carotid bruits. LUNGS: Have faint expiratory wheezes bilaterally, but normal effort and excursion.  ABDOMEN: Positive bowel sounds. Soft, nontender, mildly distended. There is no hepatosplenomegaly.  GENITOURINARY: Normal external male genitalia.  MUSCULOSKELETAL: The patient is able to move his upper extremities bilaterally as well as left lower extremity with full range of motion. I have not tested his gait, but he has only 3/5 strength of the right lower extremity. He states that his sensation is normal.  EXTREMITIES: No clubbing, cyanosis, or edema.  SKIN: Warm and dry. No rashes or lesions.  NEUROLOGIC: Cranial nerves II-IV are grossly intact. He has impairment of cranial nerve V, as stated above. Cranial nerves VI-XII are grossly intact.  PSYCHIATRIC: Mood is normal with me and affect is congruent; however, he has been belligerent with staff. His judgment and insight into his medical condition are probably poor.   ASSESSMENT AND PLAN: This is an 81 year old male admitted for cerebrovascular accident.  1.  Cerebrovascular accident. The patient has right lower extremity weakness and right-sided facial droop. This is reportedly new. CT of the head does not show any areas of ischemia, but we will obtain an MRI in 24-48 hours. I have placed a neurology consult as well. 2.  Atrial fibrillation. Continue warfarin and carvedilol. The patient is  tachycardic by the vital signs obtained on the floor. He was more rate controlled in the Emergency Department upon my exam. If is his heart rate is persistently elevated, we will increase his dose of beta blocker.  3.  Hypertension, currently controlled.  4.  Coronary artery disease, stable. The patient denies any chest pain.  5.  Diabetes mellitus type 2. We will continue the patient's Lantus at half of his home dose while he is in the hospital. I have also added sliding scale insulin per our directions to his regimen.  6.  Congestive heart failure, appears to be systolic. It is stable at this time, as the patient's lung sounds are not diminished nor does he have any crackles on physical examination. His wheezing, indeed may be a cardiac wheeze, but we will assess this going forward. The patient's last ejection fraction is reportedly 25%.  7.  Chronic kidney disease, currently stage III. This baseline for the patient.  8.  Benign prosthetic hypertrophy. Continue finasteride and tamsulosin.  9.  Dementia, stable.  10.  Deep vein thrombosis prophylaxis. Warfarin.  11.  Gastrointestinal prophylaxis. Proton pump inhibitor per her patient's home regimen.   CODE STATUS: The patient is a DNR. He does not want resuscitation in the event of cardiopulmonary  collapse.   TIME SPENT ON ADMISSION ORDERS AND PATIENT CARE: Approximately 40 minutes.    ____________________________ Kelton Pillar. Sheryle Hail, MD msd:bm D: 07/08/2014 06:42:15 ET T: 07/08/2014 07:39:11 ET JOB#: [id number]  cc: Kelton Pillar. Sheryle Hail, MD, <Dictator> Kelton Pillar Sabeen Piechocki MD ELECTRONICALLY SIGNED 07/09/2014 7:45

## 2014-09-06 NOTE — Consult Note (Signed)
Referring Physician:  Harrie Foreman   Primary Care Physician:  [facility], [address], [place], [place] [address], [place] [phone]  Reason for Consult: Admit Date: 08-Jul-2014  Chief Complaint: weakness  Reason for Consult: CVA   History of Present Illness: History of Present Illness:   see at request of Dr. Marcille Blanco for stroke;  81 yo RHD M presents to [facility] secondary to generalized weakness per note but pt has significant dementia as well.  Pt states that he is a little weak on the R but otherwise denies anything.  Pt does state that he wants to go home and that he feels fine.  ROS:  General denies complaints   HEENT no complaints   Lungs no complaints   Cardiac no complaints   GI no complaints   GU no complaints   Musculoskeletal no complaints   Extremities no complaints   Skin no complaints   Neuro no complaints   Past Medical/Surgical Hx:  Dementia:   DM:   HTN:   CAD:   CHF:   Cardiac Surgery: Two stents  Past Medical/ Surgical Hx:  Past Medical History reviewed by me as above   Past Surgical History reviewed by me as above   Home Medications: Medication Instructions Last Modified Date/Time  finasteride 5 mg oral tablet 1 tab(s) orally once a day 02-Mar-16 02:22  tamsulosin 0.4 mg oral capsule 1 cap(s) orally once a day 02-Mar-16 02:22  carvedilol 6.25 mg oral tablet 1 tab(s) orally 2 times a day 02-Mar-16 02:22  furosemide 40 mg oral tablet 1 tab(s) orally once a day (in the morning) 02-Mar-16 02:22  Lantus 100 units/mL subcutaneous solution 12 unit(s) subcutaneous once a day (in the morning) before breakfast 02-Mar-16 02:22  albuterol-ipratropium 2.5 mg-0.5 mg/3 mL inhalation solution 3 milliliter(s) inhaled every 8 hours, As Needed - for Shortness of Breath or wheezing 02-Mar-16 02:22  Coumadin 3 mg oral tablet 1 tab(s) orally once a day (in the evening) 02-Mar-16 02:22  montelukast 10 mg oral tablet  1 tab(s) orally once a day (in the evening) 02-Mar-16 02:22  omeprazole 40 mg oral delayed release capsule 1 cap(s) orally once a day 02-Mar-16 02:22  amitriptyline 10 mg oral tablet 1 tab(s) orally once a day (at bedtime) 02-Mar-16 02:22  Tylenol 500 mg oral tablet 2 tab(s) orally every 4 hours, As Needed - for Pain 02-Mar-16 02:22  MiraLax - oral powder for reconstitution 17 gram(s) orally every other day (at bedtime) 02-Mar-16 02:22  NovoLOG FlexPen 100 units/mL subcutaneous solution 1 dose(s) subcutaneous 3 times a day (30 mins before meals) per sliding scale.<140=0u, 140-180=3u, 181-220=4u, 221-260=6u, 261-320=8u, 321-360=10u, 361-400=12u, >400=15u 02-Mar-16 02:22   Allergies:  No Known Allergies:   Allergies:  Allergies NKDA   Social/Family History: Employment Status: retired  Lives With: children  Living Arrangements: house  Social History: no tob, no EtOH, no illicits  Family History: no seizures, no strokes   Vital Signs: **Vital Signs.:   02-Mar-16 11:30  Vital Signs Type Routine  Temperature Temperature (F) 97.1  Celsius 36.1  Temperature Source oral  Pulse Pulse 101  Respirations Respirations 20  Systolic BP Systolic BP 762  Diastolic BP (mmHg) Diastolic BP (mmHg) 88  Mean BP 102  Pulse Ox % Pulse Ox % 93  Pulse Ox Activity Level  At rest  Oxygen Delivery 2L   Physical Exam: General: nl weight, NAD  HEENT: normocephalic, sclera nonicteric, oropharynx clear  Neck: supple, no  JVD, no bruits  Chest: CTA B, no wheezing, good movement  Cardiac: RRR, no murmurs, 2+ pulses  Extremities: no C/C, FROM, 3+ pitting edema in legs   Neurologic Exam: Mental Status: alert and oriented to person only, good naming and repetition, very aggressive on exam and often yells for no reason, follows simple commands  Cranial Nerves: PERRLA, EOMI, nl VF, face symmetric, tongue midline, shoulder shrug equal  Motor Exam: 5-/5 B UE, 2/5 B LE  Deep Tendon Reflexes: 0/4 B, mute plantars  B  Sensory Exam: intact temp and light touch, no neglect  Coordination: F to N WNL, unable to test HTS due to weakness   Lab Results: LabObservation:  02-Mar-16 12:37   OBSERVATION PACS Image dcm.[id number].sa=[id number]  Hepatic:  02-Mar-16 01:07   Bilirubin, Total 0.8  Alkaline Phosphatase  183  SGPT (ALT) 24  SGOT (AST) 37  Total Protein, Serum 7.3  Albumin, Serum  2.4  Routine Chem:  02-Mar-16 01:07   Glucose, Serum  113  BUN  24  Creatinine (comp)  1.85  Sodium, Serum 143  Potassium, Serum 3.5  Chloride, Serum  110  CO2, Serum 26  Calcium (Total), Serum  7.8  Osmolality (calc) 290  eGFR (African American)  45  eGFR (Non-African American)  37 (eGFR values <29mL/min/1.73 m2 may be an indication of chronic kidney disease (CKD). Calculated eGFR, using the MRDR Study equation, is useful in  patients with stable renal function. The eGFR calculation will not be reliable in acutely ill patients when serum creatinine is changing rapidly. It is not useful in patients on dialysis. The eGFR calculation may not be applicable to patients at the low and high extremes of body sizes, pregnant women, and vegetarians.)  Anion Gap 7  Cardiac:  02-Mar-16 01:07   Troponin I 0.04 (0.00-0.05 0.05 ng/mL or less: NEGATIVE  Repeat testing in 3-6 hrs  if clinically indicated. >0.05 ng/mL: POTENTIAL  MYOCARDIAL INJURY. Repeat  testing in 3-6 hrs if  clinically indicated. NOTE: An increase or decrease  of 30% or more on serial  testing suggests a  clinically important change)  Routine UA:  02-Mar-16 01:07   Color (UA) Yellow  Clarity (UA) Clear  Glucose (UA) Negative  Bilirubin (UA) Negative  Ketones (UA) Negative  Specific Gravity (UA) 1.011  Blood (UA) 1+  pH (UA) 6.0  Protein (UA) 30 mg/dL  Nitrite (UA) Negative  Leukocyte Esterase (UA) Negative (Result(s) reported on 08 Jul 2014 at 01:51AM.)  RBC (UA) 1 /HPF  WBC (UA) <1 /HPF  Bacteria (UA) NONE SEEN  Epithelial Cells  (UA) NONE SEEN  Mucous (UA) PRESENT (Result(s) reported on 08 Jul 2014 at 01:51AM.)  Routine Coag:  02-Mar-16 01:07   Prothrombin  24.1 (11.4-15.0 NOTE: New Reference Range  06/05/14)  INR 2.1 (INR reference interval applies to patients on anticoagulant therapy. A single INR therapeutic range for coumarins is not optimal for all indications; however, the suggested range for most indications is 2.0 - 3.0. Exceptions to the INR Reference Range may include: Prosthetic heart valves, acute myocardial infarction, prevention of myocardial infarction, and combinations of aspirin and anticoagulant. The need for a higher or lower target INR must be assessed individually. Reference: The Pharmacology and Management of the Vitamin K  antagonists: the seventh ACCP Conference on Antithrombotic and Thrombolytic Therapy. XNATF.5732 Sept:126 (3suppl): [id number]. A HCT value >55% may artifactually increase the PT.  In one study,  the increase was an average of 25%. Reference:  "Effect on Routine and  Special Coagulation Testing Values of Citrate Anticoagulant Adjustment in Patients with High HCT Values." American Journal of Clinical Pathology 2006;126:400-405.)  Activated PTT (APTT)  47.2 (A HCT value >55% may artifactually increase the APTT. In one study, the increase was an average of 19%. Reference: "Effect on Routine and Special Coagulation Testing Values of Citrate Anticoagulant Adjustment in Patients with High HCT Values." American Journal of Clinical Pathology 2006;126:400-405.)  Routine Hem:  02-Mar-16 01:07   WBC (CBC) 6.0  RBC (CBC)  4.19  Hemoglobin (CBC)  10.7  Hematocrit (CBC)  32.5  Platelet Count (CBC) 238  MCV  78  MCH  25.5  MCHC 32.8  RDW  17.3  Neutrophil % 42.5  Lymphocyte % 32.5  Monocyte % 18.1  Eosinophil % 4.0  Basophil % 2.9  Neutrophil # 2.6  Lymphocyte # 2.0  Monocyte #  1.1  Eosinophil # 0.2  Basophil #  0.2 (Result(s) reported on 08 Jul 2014 at 01:55AM.)    Radiology Results: MRI:    02-Mar-16 12:37, MRI Brain Without Contrast  MRI Brain Without Contrast   REASON FOR EXAM:    RLE weakness and R side facial droop  COMMENTS:       PROCEDURE: MR  - MR BRAIN WO CONTRAST  - Jul 08 2014 12:37PM     CLINICAL DATA:  Right lower extremity weakness and right-sided  facial droop.    EXAM:  MRI HEAD WITHOUT CONTRAST    TECHNIQUE:  Multiplanar, multiecho pulse sequences of the brain and surrounding  structures were obtained without intravenous contrast.  COMPARISON:  Head CT 07/08/2014    FINDINGS:  Images are moderately degraded by motion artifact.    There isno evidence of acute infarct, intracranial hemorrhage,  mass, midline shift, or extra-axial fluid collection. There is  moderate generalized cerebral atrophy. A chronic, small right  frontoparietal cortical/ subcortical infarct is noted near the  vertex. Patchy and confluent T2 hyperintensities elsewhere in the  subcortical and deep cerebral white matter bilaterally are  nonspecific but compatible with moderate chronic small vessel  ischemic disease.    Prior bilateral cataract extraction is noted. Mild paranasal sinus  mucosal thickening is present. No significant mastoid air cell fluid  is identified. Intracranial vascular flow voids are not well  evaluated due to motion artifact.     IMPRESSION:  1. No acute intracranial abnormality.  2. Moderate chronic small vessel ischemic disease and cerebral  atrophy. Remote right frontoparietal infarct.      Electronically Signed    By: Logan Bores    On: 07/08/2014 12:47       Verified By: Ferol Luz, M.D.,  CT:    02-Mar-16 01:26, CT Head Without Contrast  CT Head Without Contrast   REASON FOR EXAM:    slurred speech right arm weakness  COMMENTS:       PROCEDURE: CT  - CT HEAD WITHOUT CONTRAST  - Jul 08 2014  1:26AM     CLINICAL DATA:  Slurred speech and right arm weakness    EXAM:  CT HEAD WITHOUT  CONTRAST    TECHNIQUE:  Contiguous axial images were obtained from the base of the skull  through the vertex without intravenous contrast.    COMPARISON:  01/26/2014  FINDINGS:  Skull and Sinuses:Negative for fracture or destructive process. Mild  inflammatory mucosal thickening in the visible ethmoid sinuses.    Orbits: No acute abnormality.    Brain: No evidence of acute infarction, hemorrhage, hydrocephalus,  or mass lesion/mass effect. There is a stable pattern of patchy  small vessel ischemic gliosis throughout the bilateral cerebral  white matter. Remote small subcortical infarct involving the high  and posterior right frontal lobe. Generalized brain atrophy, stable  from 2015.     IMPRESSION:  1. No acute intracranial findings.  2. Stable brain atrophy and small vessel ischemia.      Electronically Signed    By: Monte Fantasia M.D.    On: 07/08/2014 02:34         Verified By: Gilford Silvius, M.D.,   Radiology Impression: Radiology Impression: CT of head personally reviewed by me and shows moderate white matter changes   Impression/Recommendations: Recommendations:   prior notes reviewed by me reviewed by me   Generalized weakness-  resolved, unclear etiology Chronic white matter changes-  stable and likely contributing to dementia Dementia-  due to aggressiveness there may be a neurodegerative component to this neuropsychiatric testing as outpatient continue coumadin for goal INR 2-3 needs to take ASA 59m daily as well check LDL and adjust statin to get < 100 will likely need Aricept at some time will sign off, please call with questions [facility]  Electronic Signatures: SJamison Neighbor(MD)  (Signed 02-Mar-16 13:47)  Authored: REFERRING PHYSICIAN, Primary Care Physician, Consult, History of Present Illness, Review of Systems, PAST MEDICAL/SURGICAL HISTORY, HOME MEDICATIONS, ALLERGIES, Social/Family History, NURSING VITAL SIGNS,  Physical Exam-, LAB RESULTS, RADIOLOGY RESULTS, Recommendations   Last Updated: 02-Mar-16 13:47 by SJamison Neighbor(MD)

## 2014-09-06 NOTE — Consult Note (Signed)
PATIENT NAME:  John Thompson, John Thompson MR#:  [id number] DATE OF BIRTH:  January 13, 1929  DATE OF CONSULTATION:  08/05/2014  CONSULTING PHYSICIAN:  Pauletta BrownsYuriy Tayten Heber, MD  HISTORY OF PRESENT ILLNESS: Information is obtained from the chart and the patient's daughter via phone. This is an 81 year old African American male with past medical history of strokes, hypertension, chronic atrial fibrillation with an ejection fraction of about 24%-25%, diabetes, chronic kidney disease, Alzheimer's, dementia, brought in because of altered mental status. The patient has been having decreased p.o. intake. The patient is also a nursing home resident.  The patient was started on antibiotics for suspicion of pneumonia and scrotal swelling, ceftriaxone and azithromycin. New medications for the patient as per daughter include Xanax and amitriptyline.   PAST MEDICAL HISTORY: Significant for chronic systolic heart failure with ejection fraction of 25%, atrial fibrillation, coronary artery disease, hypertension, diabetes, chronic kidney disease, type 2 diabetes.   HOME MEDICATIONS: Have been reviewed.   IMAGING: CAT scan of the head: No acute intracranial abnormality, generalized atrophy and old infarcts. MRI of the brain shows acute right cerebellar infarct.   SOCIAL HISTORY[facility]  NEUROLOGIC EVALUATION: The patient appears to be agitated, but moving all his extremities symmetrically. Tracks me through the room, able tell me his name. Withdraws from painful stimuli bilaterally.   IMPRESSION: An 81 year old African American male with significant past medical history, is a resident of nursing home facility.  He presented with altered mental status, confusion, and poor p.o. intake. Current mental status is likely multifactorial. The patient does have a new infarct in the right cerebellum. The patient has Alzheimer dementia with a vascular dementia component where mental status progresses in a stepwise  fashion. The patient has also been recently started on benzodiazepines, which can worsen his mental status. Also delirium is likely a component of the current situation.   PLAN:  1.  Would continue Coumadin but try to get his INR closer to between 2-3, as his INR is 3.7, supratherapeutic and there is a high chance of him bleeding into that cerebellar stroke. 2.  For sleep at night if agitated, would use atypical antipsychotics such as Seroquel, hold Xanax, hold amitriptyline. Would hold off any benzodiazepine. 3.  Infectious treatment as per primary team.  4.  At this point, I do not think we need any further imaging as the stroke is likely atrial fibrillation-related despite being on anticoagulation.   This case was discussed with John Thompson, who is his oldest daughter and power of attorney, via phone number [phone], as well as the nursing staff.   Thank you, it was a pleasure seeing this patient.    ____________________________ Pauletta BrownsYuriy Javaria Knapke, MD yz:LT D: 08/05/2014 14:38:39 ET T: 08/05/2014 16:20:07 ET JOB#: [id number]  cc: Pauletta BrownsYuriy Errika Narvaiz, MD, <Dictator> Pauletta BrownsYURIY [place] Arroyave MD ELECTRONICALLY SIGNED 08/11/2014 15:48

## 2014-09-06 NOTE — Discharge Summary (Signed)
PATIENT NAME:  John NortonWOODS, Camdon MR#:  [id number] DATE OF BIRTH:  Jun 19, 1928  DATE OF ADMISSION:  07/08/2014 DATE OF DISCHARGE:  07/11/2014  PRIMARY CARE PHYSICIAN: Marland McalpineSheikh A. Ellsworth Lennoxejan-Sie, MD   ADDENDUM:   For detailed discharge summary, please refer to the dictation by me 2 days ago. The patient has been treated for acute on chronic respiratory failure with hypoxia.   For acute on chronic respiratory failure with hypoxia, the patient has been treated with nebulizer, Lasix, and oxygen. The patient has been on oxygen 2 liters.   For acute on chronic systolic congestive heart failure with ejection fraction 25%, he has been treated with Lasix, lisinopril, and Aldactone. The patient's symptoms are better but still on chronic oxygen 2 liters by nasal cannula.   The patient's vital signs are stable. He is clinically stable, will be discharged to a skilled nursing home today. I discussed the patient's discharge plan with the nurse and social worker.   TIME SPENT: About 33 minutes.    ____________________________ Shaune PollackQing Kade Rickels, MD qc:TM D: 07/11/2014 13:39:32 ET T: 07/11/2014 16:59:27 ET JOB#: [id number]  cc: Shaune PollackQing Tammatha Cobb, MD, <Dictator> Shaune PollackQING Christepher Melchior MD ELECTRONICALLY SIGNED 07/12/2014 14:19

## 2014-09-06 NOTE — H&P (Signed)
PATIENT NAME:  John Thompson, John Thompson MR#:  [id number] DATE OF BIRTH:  August 15, 1928  DATE OF ADMISSION:  08/03/2014  PRIMARY CARE PHYSICIAN:  Dr. Ellsworth Lennox.    EMERGENCY ROOM PHYSICIAN:  Dr. Fanny Bien.   CHIEF COMPLAINT:  Decreased p.o. intake and also change in mental status.   HISTORY OF PRESENT ILLNESS: This is an 81 year old African-American male with a history of CVA, hypertension, chronic atrial fibrillation, type 2 diabetes mellitus, CKD, BPH, Alzheimer dementia, brought in by the family because of his change in mental status. The patient has been weak since Saturday and family thinks he had a mini stroke on Saturday because he was having some slurred speech and refusing to get up from bed and also having some confusion. The patient has baseline dementia, but according to family he is more confused than usual and also decreased p.o. intake and change in mental status. Concerning this they thought the patient may have mini stroke. The patient's CT head is unremarkable here. The patient noted to have some dementia, was noted to have some pneumonia and also some scrotal cellulitis. The patient has been started on Xanax for his agitation and also amitriptyline. The patient's amitriptyline started at 10 mg since yesterday and the patient's family especially the daughter says she is not aware of that and she found her dad very sleepy yesterday and today and she is really concerned about that. The patient had no fever. No cough, but in the ER the patient noted to have some apnea spells. Concerning that the ER doctor wanted me to admit.   PAST MEDICAL HISTORY:  1. Significant for chronic systolic heart failure, EF of 04%.   2. History of chronic atrial fibrillation.  3. Coronary artery disease.  4. Hypertension.  5. Diabetes.   6. CKD stage III/  7. BPH.   8. Type 2 diabetes mellitus.    MEDICATIONS:  1. Nebulizer DuoNebs every 8 hours for trouble breathing.  2. Amitriptyline 10 mg at bedtime, looks like  amitriptyline has been in the discharge medications in the last discharge summary.   3. Coreg 6.25 mg p.o. b.i.d.   4. Coumadin 3 mg daily.  5. Finasteride 5 mg p.o. daily.  6. Furosemide 40 mg in the morning.   7. Lantus 12 units before breakfast.  8. Lisinopril 5 mg daily.  9. Aldactone 12.5 mg daily.  10. Simvastatin 40 mg at bedtime.  11. Omeprazole 40 mg daily.  12. Singulair 10 mg daily.  13. NovoLog FlexPen t.i.d. with meals according to sliding scale.  14. Tamsulosin 0.4 mg daily.  15. Tylenol 325  mg 2 tablets every 6 hours as needed for pain.    The patient's last admission significant for admission on March 2, discharged on March 3, admitted at that time because of CHF exacerbation.    ALLERGIES: No known allergies.    SOCIAL HISTORY:  Is living at [facility], the patient is living there since March 1.   FAMILY HISTORY: Significant for both parents lived until 52s.  No hypertension, diabetes, or coronary artery disease.   PAST SURGICAL HISTORY:  History of possible biopsy for the prostate.   SOCIAL HISTORY: The patient is living at [facility] ([place] Previous smoker.    REVIEW OF SYSTEMS: Unable to obtain due to dementia, but family says that they noticed a scrotal ulcer that has been there for a long time, but the family is aware only of this today.   PHYSICAL EXAMINATION:  VITAL SIGNS: Temperature 100,  blood pressure is 87/64 initially, repeat blood pressure is 100/70, heart rate 64. Oxygen saturation is 100% on room air.  GENERAL:  The patient is awake but not oriented to time, place, person, has severe dementia. HEENT: Normocephalic, atraumatic. Pupils equally reacting to light. Extraocular movements intact. Mucus membranes are clinically dry. No fascial droop.  NECK: Thyroid enlargement is not seen. Trachea in the midline. No lymphadenopathy.   CHEST:  Symmetric, atraumatic.  CARDIOVASCULAR: S1, S2, irregularly irregular. The patient is in atrial fibrillation. No  carotid bruit. The patient has no pedal edema.  LUNGS: The patient has no expiratory wheeze in all lung fields. The patient is not in respiratory distress. Not using accessory muscles of respiration.  ABDOMEN: Soft, nontender. Bowel sounds present. No hernias.  GENITOURINARY: The patient noted to have area of erythema with tenderness to palpation, has some serosanguinous discharge present.  MUSCULOSKELETAL: The patient has no kyphosis or scoliosis. Has no rigidity or spasticity in the legs.  NEUROLOGIC: The patient has no facial droop, no dysarthria, but unable to do full neurological exam secondary to severe dementia and not able to participate.  PSYCHIATRIC;  The patient is oriented to time, place, person, and according to the daughter that is his baseline.   LABORATORY DATA: WBC is 9.1, hemoglobin 10.8, hematocrit 33.9, platelets 299,000. Electrolytes, sodium is 149, potassium 4.1, chloride 118, bicarbonate 25, BUN 34, creatinine 1.6, glucose 114. The patient's AST 27, ALT 15. Troponin 0.04. UA is clear, no leukocyte esterase. INR 2.8. Chest x-ray, atelectasis and pneumonia retrocardiac region, COPD, and cardiomegaly. CT of the head shows no acute intracranial pathology, chronic microvascular changes, and cerebral atrophy. EKG, atrial fibrillation with RVR, 117 beats per minute.   ASSESSMENT AND PLAN:  1.  Altered mental status and possibly worsening of his dementia, but also altered mental status could be due to his pneumonia. The patient was slightly hypotensive and also had a temperature of 100F on arrival.  The patient's blood pressure improved nicely with fluids, so he is going to be admitted for systemic inflammatory response syndrome secondary to pneumonia, started on IV Rocephin, Zithromax, follow the blood cultures, and continue IV fluids.  2.  Possible stroke. The patient's neurological exam nondiagnostic. CT is unremarkable, but the daughter is concerned that he may have a mini stroke. We  will get MRI of the brain.  3.  The patient has atrial fibrillation with rapid ventricular response,  heart rate is up to 112, he is slightly dehydrated. Continue IV fluids and continue his home medications, beta blockers, and also Coumadin. He is on Coreg at 6.25 b.i.d., we will continue that and monitor him on telemetry.   4.  Chronic systolic heart failure. The patient is on  beta blockers, ACE inhibitors, and ARBs.   We will continue that and hold the Lasix because he looks clinically dry.  5.  Scrotal cellulitis versus possible cancer. The patient has erythema and skin peeling on the scrotal area. We will consult urology and he will follow up with them.  6.  The patient has diabetes mellitus type 2. He is on NovoLog Flex pen and also Lantus, continue them, and start on low sodium ADA diet.  7.  Dementia. The patient is started on amitriptyline, but it looks like he been on it before. Because of his sedation and change in mental status the patient's amitriptyline will be held today. 8.  Code status DNR.    Discussed the whole plan with the patient's daughter.  TIME SPENT: 55 minutes.    ____________________________ Katha HammingSnehalatha Alandis Bluemel, MD sk:bu D: 08/03/2014 19:14:48 ET T: 08/03/2014 19:39:33 ET JOB#: [id number]  cc: Katha HammingSnehalatha Afnan Cadiente, MD, <Dictator> Katha HammingSNEHALATHA Kelley Knoth MD ELECTRONICALLY SIGNED 08/09/2014 16:00

## 2014-09-06 NOTE — Discharge Summary (Signed)
PATIENT NAME:  John Thompson, John Thompson MR#:  [id number] DATE OF BIRTH:  March 05, 1929  DATE OF ADMISSION:  07/08/2014 DATE OF DISCHARGE:  07/09/2014  PRIMARY CARE PHYSICIAN: Dr. Ellsworth Lennoxejan Thompson DISCHARGE DIAGNOSES:  1. Acute on chronic systolic congestive heart failure, ejection fraction 25%.  2. Acute on chronic respiratory failure with hypoxia.  3. Atrial fibrillation. 4. Chronic kidney disease, stage III.  5. Coronary artery disease.  6. Hypertension.  7. Dementia.   CONDITION: Stable.   CODE STATUS: DNR.   HOME MEDICATIONS: Please refer to the medication reconciliation list.   DIET: Low-sodium, pureed diet with thin liquids. Please see the details for diet consistency in the instructions.   ACTIVITY: As tolerated.   FOLLOW-UP CARE: Follow with PCP within 1 to 2 weeks. [facility] within 3 months.   REASON FOR ADMISSION: Right lower extremity weakness, as well as some right facial droop.   HOSPITAL COURSE: The patient is an 81 year old, African American male, with a history of atrial fibrillation, hypertension and diabetes, who was sent from assisted living due to right leg weakness and right facial droop. For detailed history and physical examination, please refer to the admission note dictated by Dr. Sheryle Thompson. The patient was suspected to have an acute CVA; however, CAT scan of head did not show any ischemia. MRI of the brain did not show any acute CVA. The patient has been treated with aspirin and a statin. In addition, the patient has atrial fibrillation, rate is controlled. The patient is on Coumadin. INR is subtherapeutic.   The patient developed shortness of breath with hypoxia due to acute on chronic systolic congestive heart failure, which has been treated with Lasix IV b.i.d. The patient's symptoms are better. The patient is on home oxygen 2 liters. We will continue home oxygen. Continue Lasix. The patient has chronic dementia, sometimes agitated. According to the patient's  daughter, this is the patient's baseline.   The patient is clinically stable. He will be discharged to nursing home today. I discussed the patient's discharge plan with the patient's daughter, nurse and social worker.   TIME SPENT: About 37 minutes.    ____________________________ John PollackQing Xayla Puzio, MD qc:John Thompson D: 07/09/2014 11:42:00 ET T: 07/09/2014 11:56:53 ET JOB#: [id number]  cc: John PollackQing Zyrell Carmean, MD, <Dictator> John PollackQING Dannisha Eckmann MD ELECTRONICALLY SIGNED 07/09/2014 18:01

## 2014-09-06 NOTE — Consult Note (Signed)
This is an 81 y/o AA who is a resident of [facility] who was admitted for altered mental status and was found to have skin peeling of the scrotum with erythema.  Patient has dementia and is unable to give a history.  His daughter and ex-wife are in the room with him.  They left the room to get PPE and they did not come back during my examination.  They are very upset with the SNIF.  The daughter had pictures on her phone from last night's admission of his scrotum and his penis.  He is uncircumcised and had smegma over his glands in the pictures.  The scrotum has areas where the epidermis is missing with clean dermis underneath.  The areas of peeling and erythema look consistent with pressure sores.  exam, the head of the penis is clean and the foreskin is easily pulled back and over the head of the glands.  The scrotal skin is without thickness or areas of induration.  The areas where the epidermis is peeled away are clean and dry with discreet margins.  There are no masses or areas of fluctuation palpated in the scrotum or perineal area and the testicles are normal.  He was combative during the exam and we needed two people to assist me with the examination of his scrotum and penis.   care has been consulted and the patient is receiving azithromycin and ceftriaxone for his pneumonia which should provide adequate coverage for the scrotal skin.   He is able to call out when he has to void and his UOP has been 400cc since 10am today.   had been on finasteride and tamsulon upon his admission and they are continued during this admission.   spoke to Dr. Annabell Howells concerning my findings and he agrees with wound care consultation. will continue to follow.    Electronic Signatures for Addendum Section: Anner Crete (MD)  (Signed Addendum [phone] 20:33) CC: Scrotal ulcer.  See note from [facility].   We were asked to see Mr. Mickley in consultation by a Dr. Luberta Mutter for evaluation of a scrotal lesion.  have  reviewed the patient's current , past, soc and family history as documented in the chart. is demented and is unable to provide appropriate responses for a ROS.  WD, WN demented BM in NAD             GU:  He has an uncircumcised phallus with a retractable foreskin and adequate meatus.  The scrotum is redundant and lodged beneath the patient during my initial exam.   I was able to bring the scrotum into view and he now has a clean area of desquamation on the inferior aspect of the scrotum that is 4-5cm in diameter.  There is no induration or thickening of the scrotal skin to suggest cellulitis and there is no crepitus.  The testes are unremarkable.   reviewed.  His UA is unremarkable.   He has a Cr. of 1.59.  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   It is important to try to keep the patient from lying or sitting on his scrotum which is the probable cause of the problem.   An alternative would be to consider the use of duoderm or a similar dressing material to protect the area. needs no surgical intervention.  Agree with wound care consult.  Please reconsult as needed.   Dr. Katha Hamming  Electronic Signatures: Michiel Cowboy A (PA) (Signed on 29-Mar-16  13:51)  Authored   Last Updated: 29-Mar-16 20:33 by Anner CreteWrenn, John J (MD)

## 2014-10-27 IMAGING — CR DG CHEST 1V PORT
1 series · 1 of 1 positions shown · non-contrast
Comparison: Chest radiograph from 12/15/2013

CLINICAL DATA: Difficulty breathing.  Chest pain.

EXAM:
PORTABLE CHEST - 1 VIEW

[dxr portable chest single view]
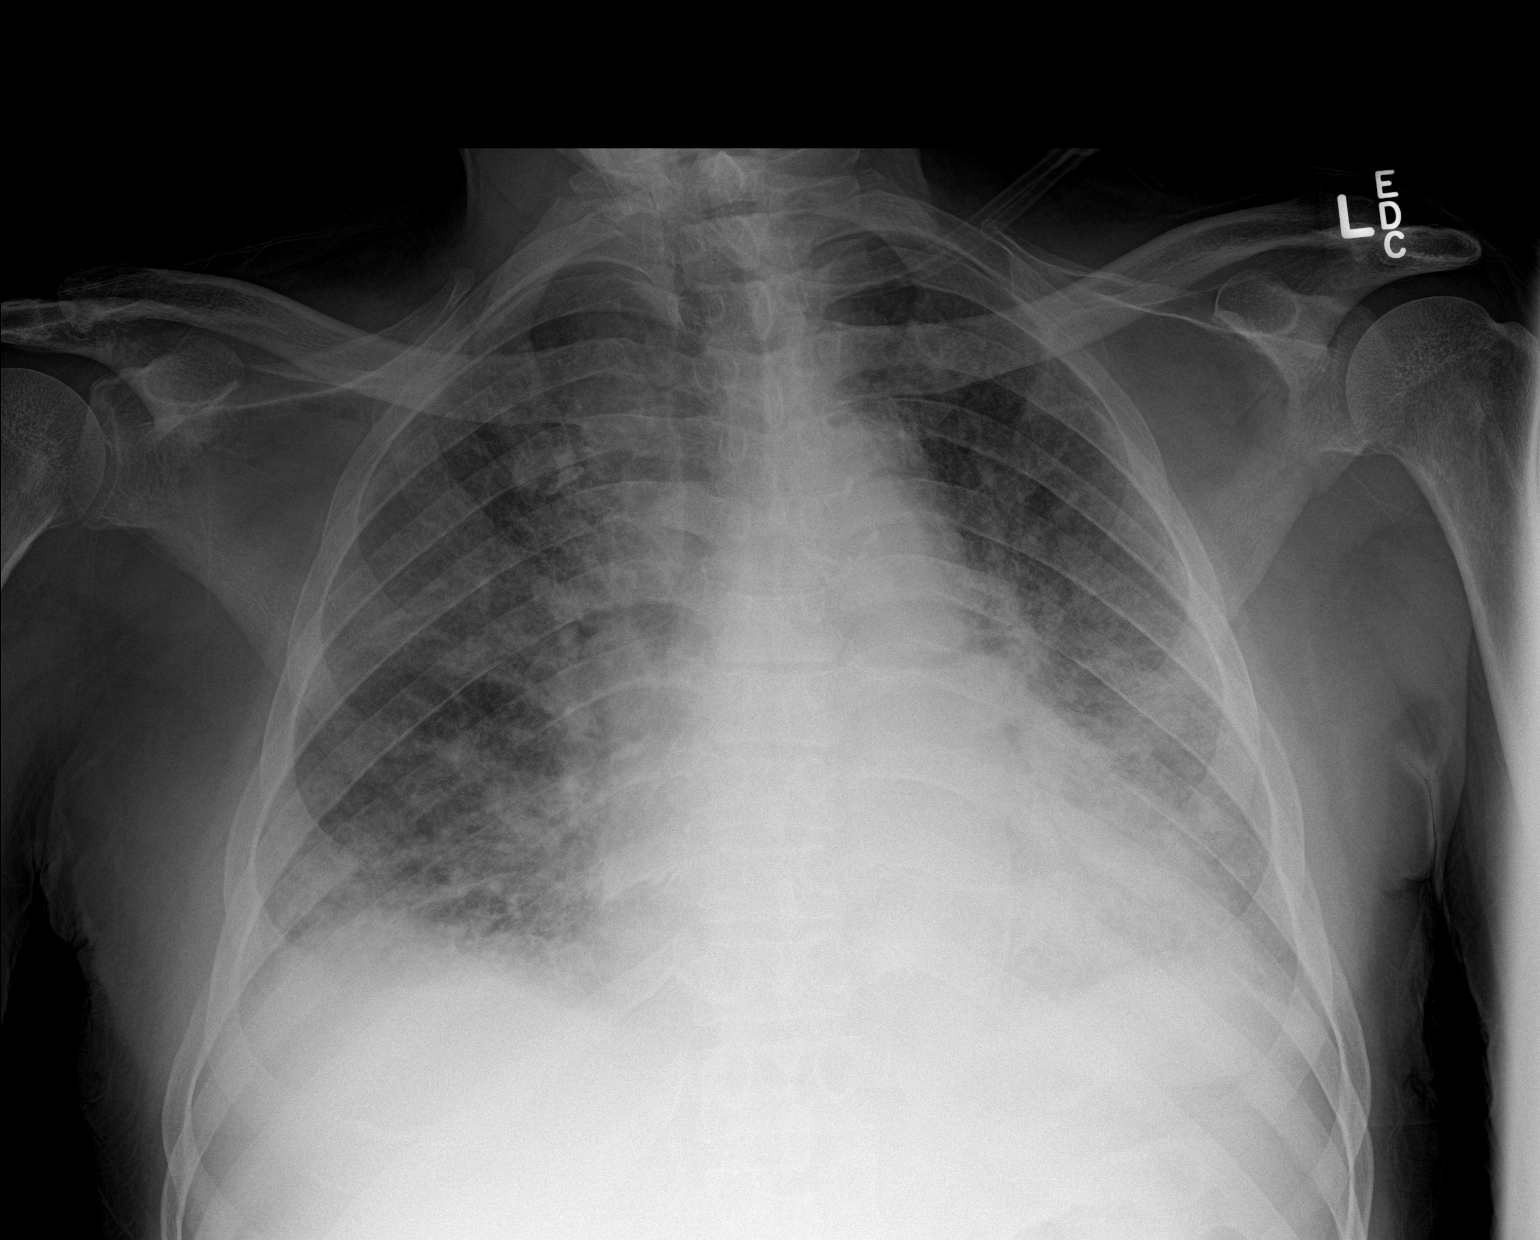

[1 of 1 positions shown; findings below may reference images not displayed]

FINDINGS: The lungs are well expanded. Patchy bilateral airspace opacities,
most prominent at the left lung base, may reflect multifocal
pneumonia or pulmonary edema. Underlying vascular congestion is
seen. No definite pleural effusion or pneumothorax is identified.

The cardiomediastinal silhouette is borderline normal in size. No
acute osseous abnormalities are identified.
IMPRESSION: Patchy bilateral airspace opacities, most prominent at the left lung
base, may reflect multifocal pneumonia or pulmonary edema.
Underlying vascular congestion noted.

## 2014-10-29 IMAGING — CR DG CHEST 2V
1 series · 2 of 2 positions shown · non-contrast
Comparison: 01/18/2014

CLINICAL DATA: Followup pneumonia

EXAM:
CHEST  2 VIEW

[Series 1: x chest ap · 0.14mm/px · 2 of 2 slices shown]
[im 1/2]
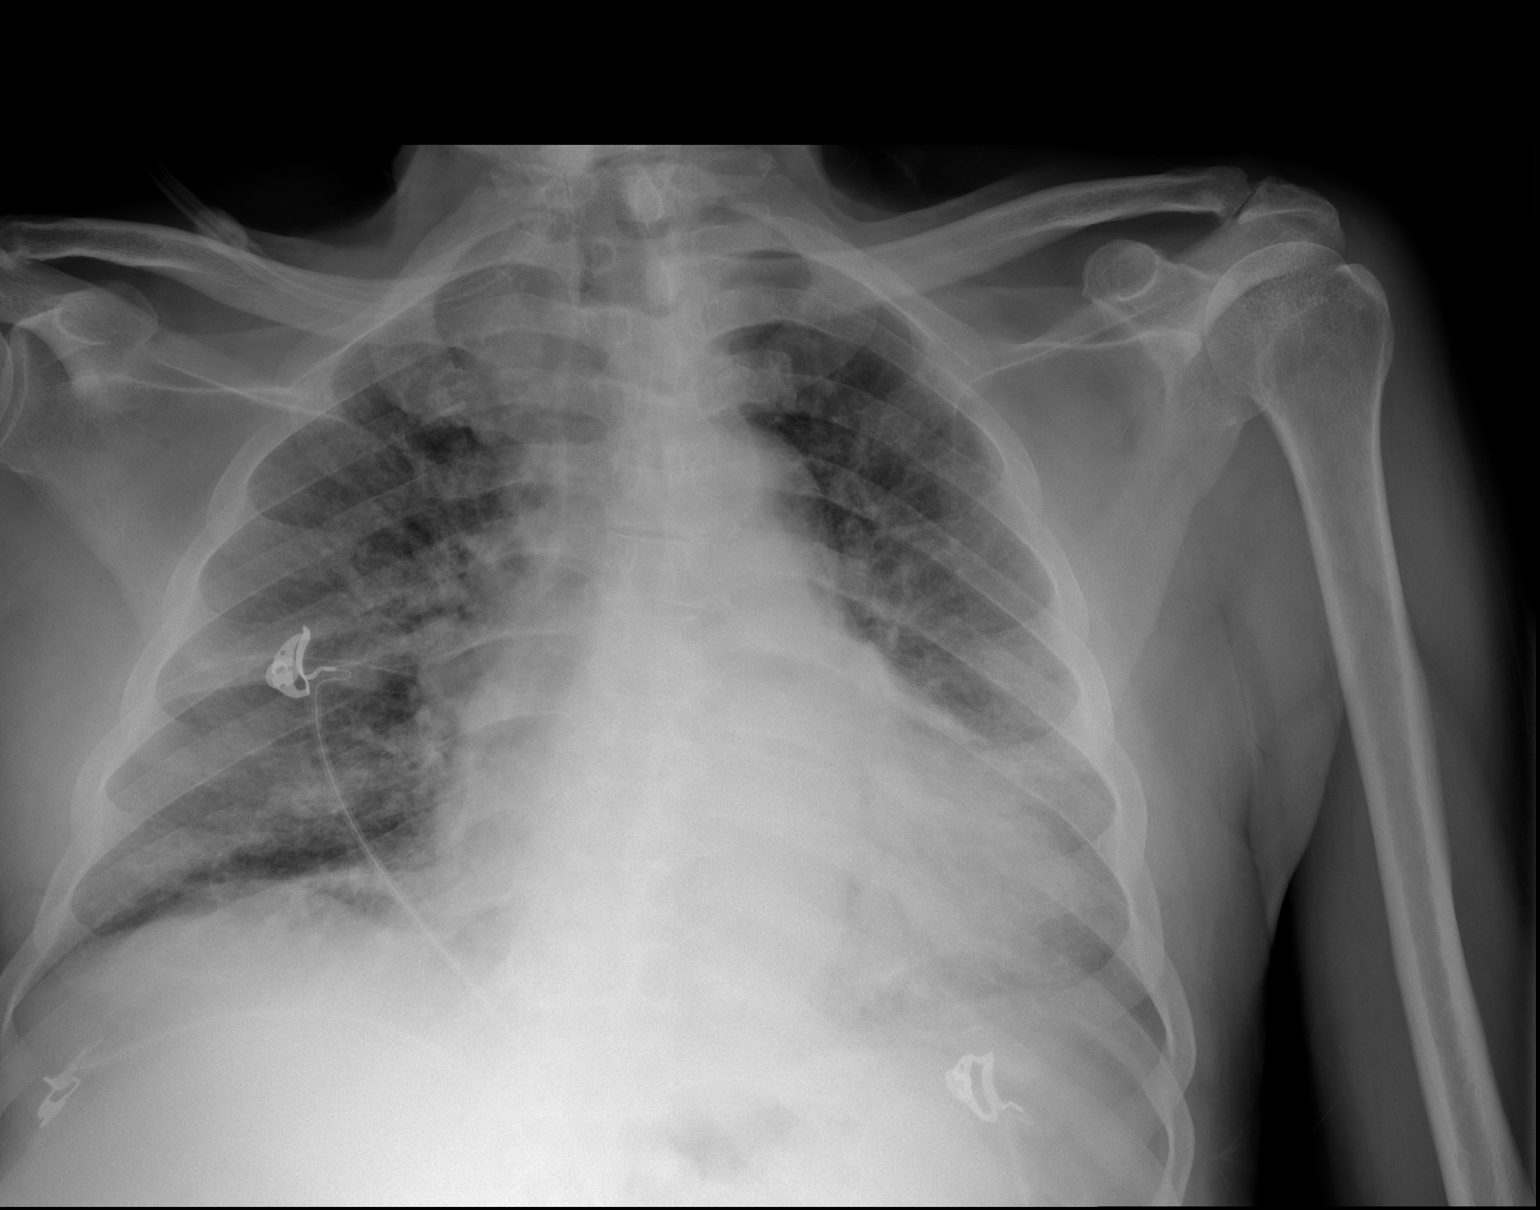
[im 2/2]
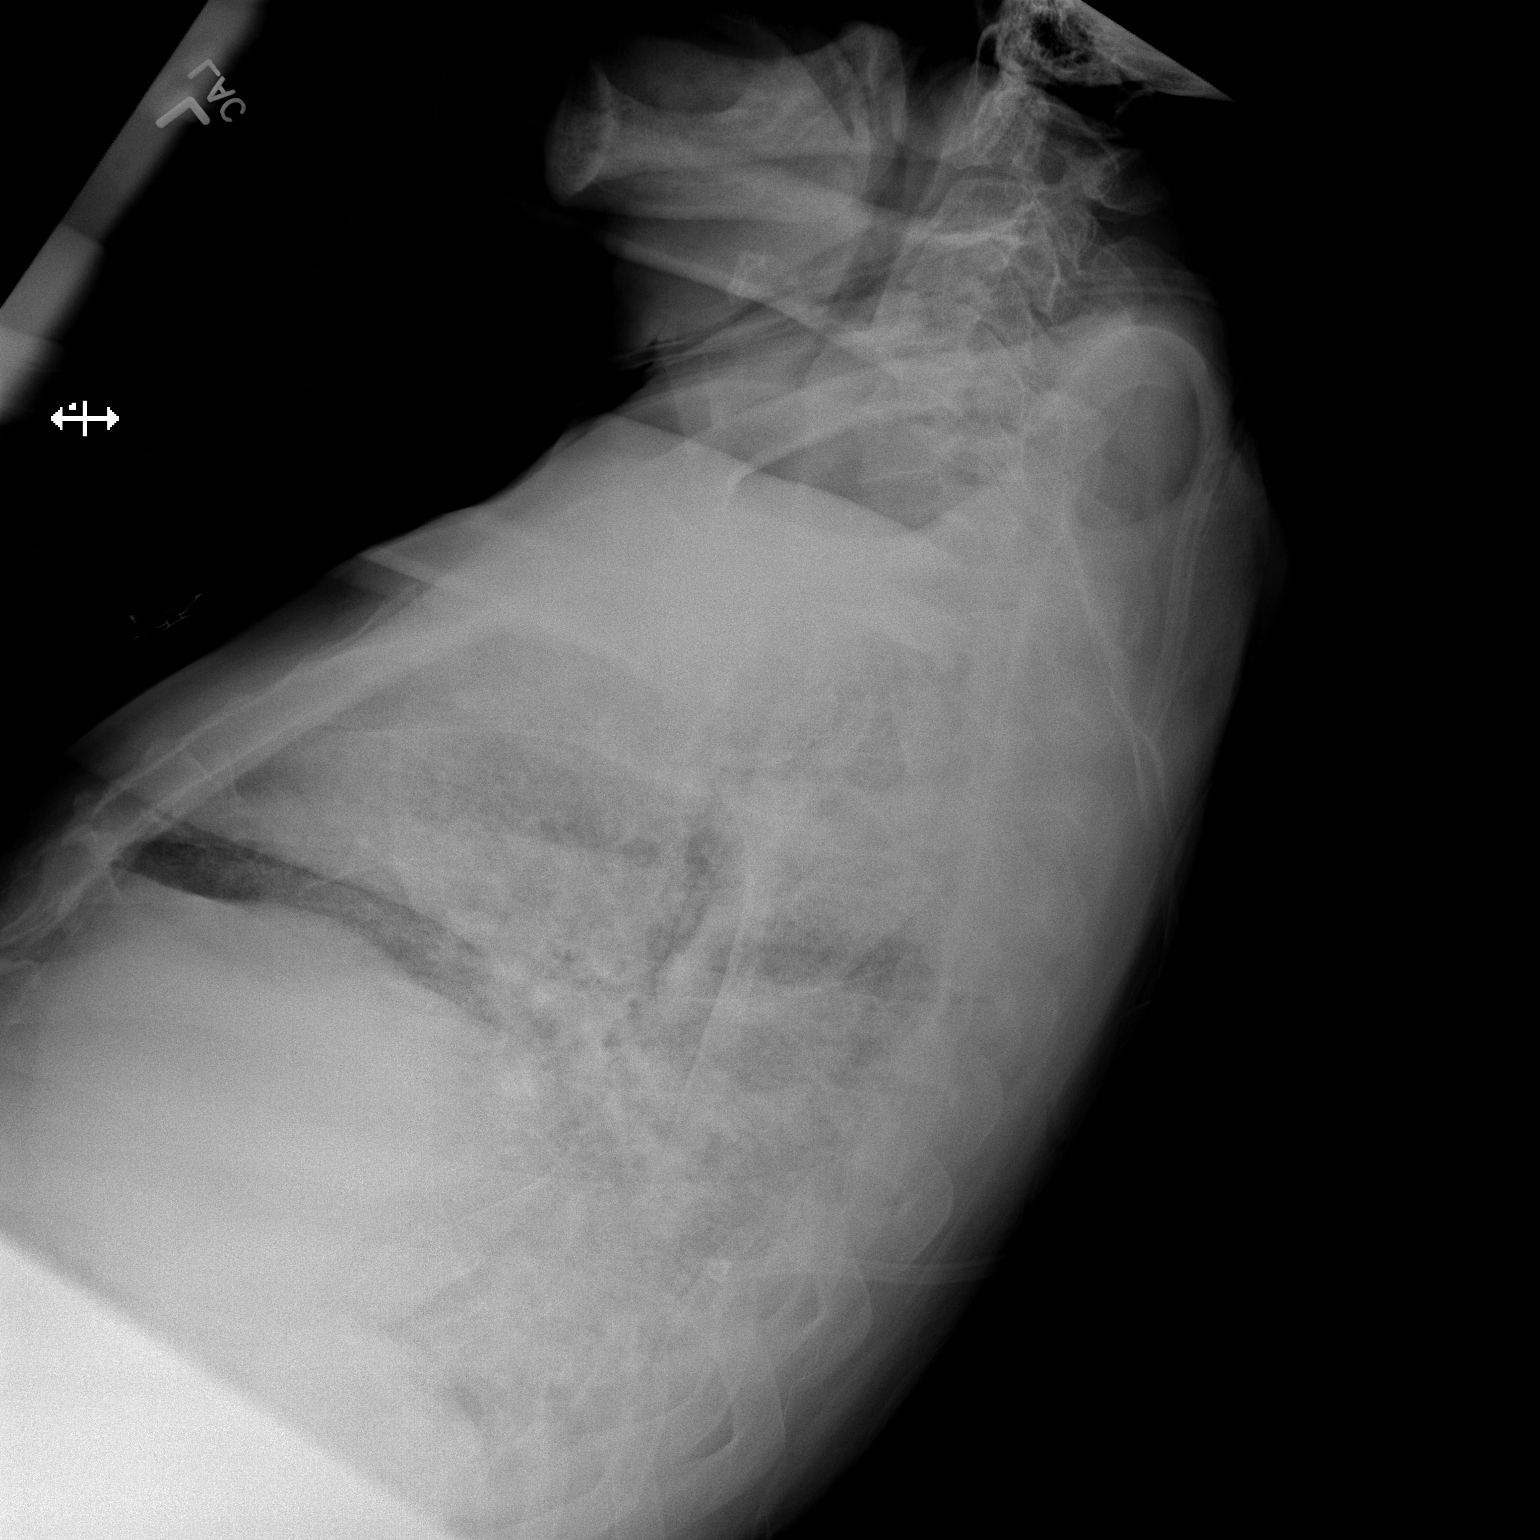

[2 of 2 positions shown; findings below may reference images not displayed]

FINDINGS: Cardiac shadow is enlarged but stable. Patchy infiltrative changes
are again noted throughout both lungs but improved when compare with
the prior exam. Continued followup is recommended.
IMPRESSION: Continued patchy infiltrate bilaterally with some improvement from
the previous study.

## 2014-11-04 IMAGING — CT CT HEAD WITHOUT CONTRAST
1 series · 16 of 30 positions shown, 20 images · non-contrast
Comparison: 12/12/2013.

CLINICAL DATA: Lethargy, altered mental status.

EXAM:
CT HEAD WITHOUT CONTRAST
TECHNIQUE: Contiguous axial images were obtained from the base of the skull
through the vertex without intravenous contrast.

[Series 2: head wo · axial · 0.46mm/px · z∈[-45,+105]mm · 16 of 35 slices shown, 20 images]
[im 2/35  brain]
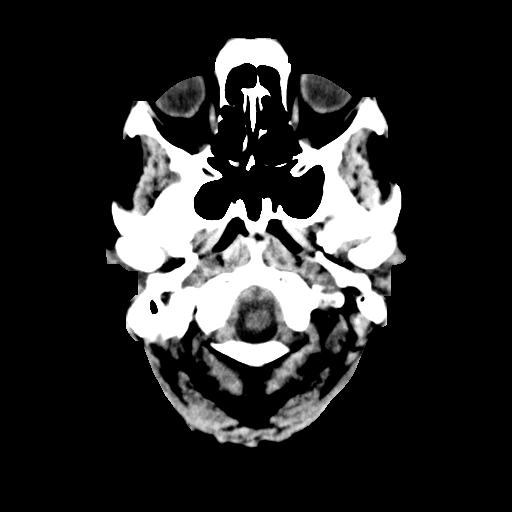
[im 2/35  bone]
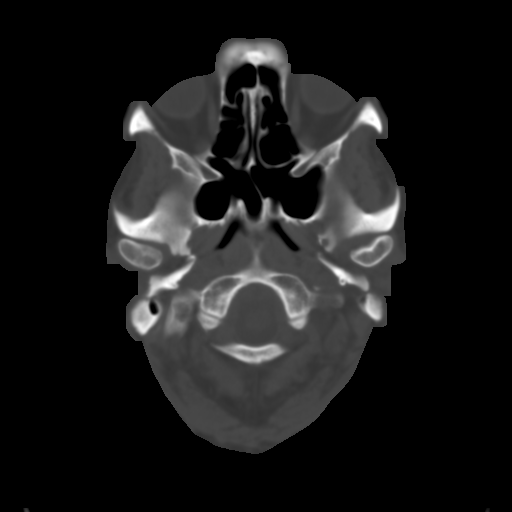
[im 4/35  brain]
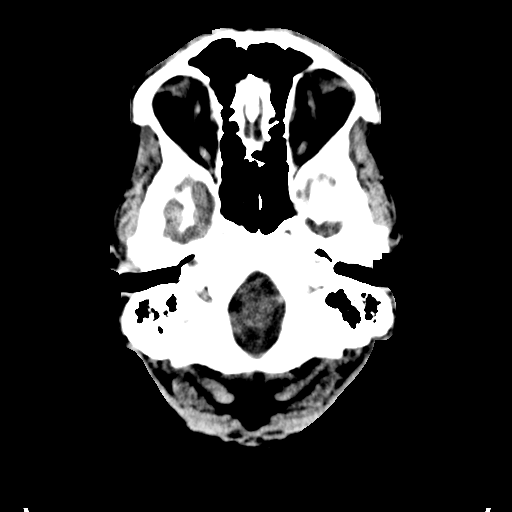
[im 6/35  brain]
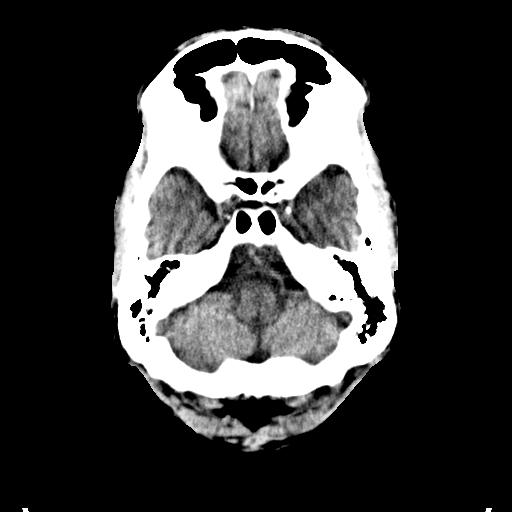
[im 9/35  brain]
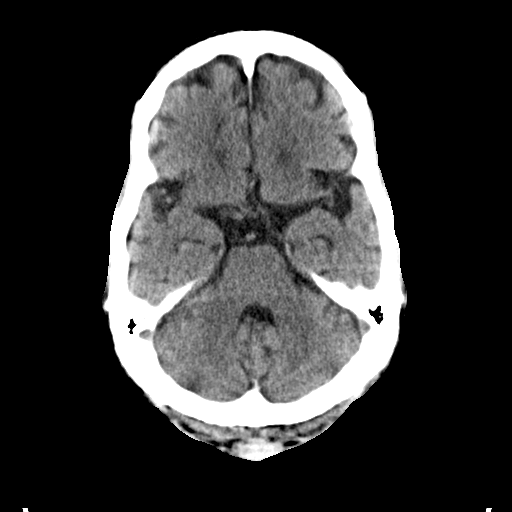
[im 10/35  brain]
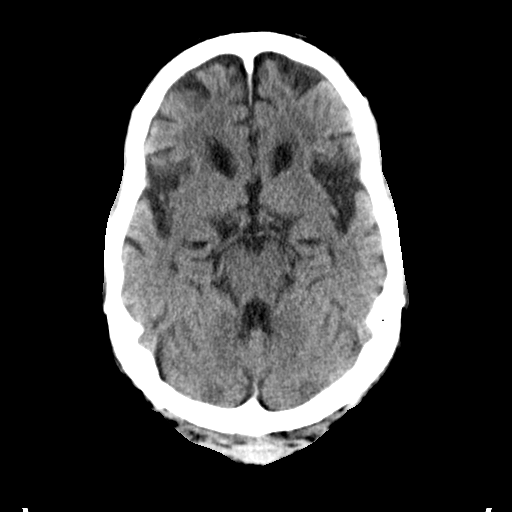
[im 10/35  bone]
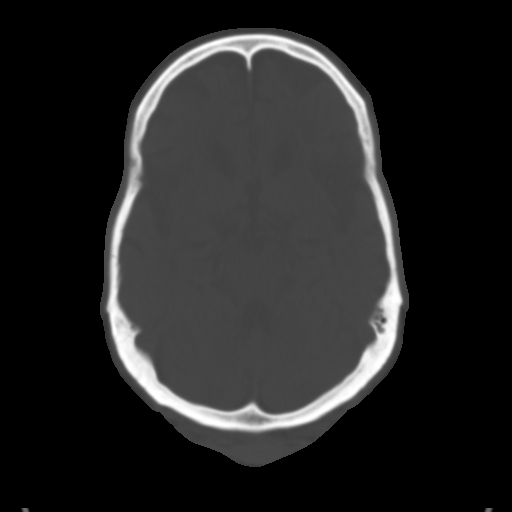
[im 12/35  brain]
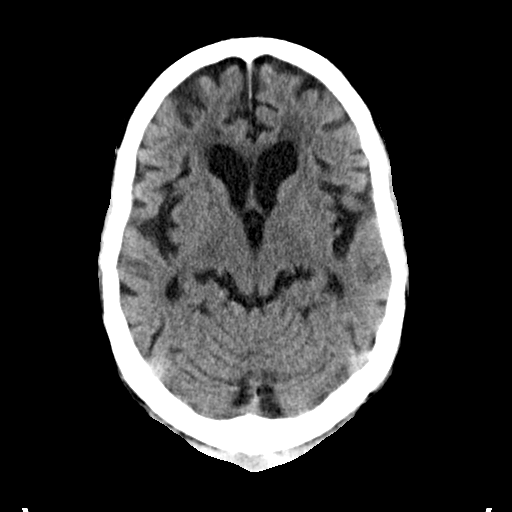
[im 15/35  brain]
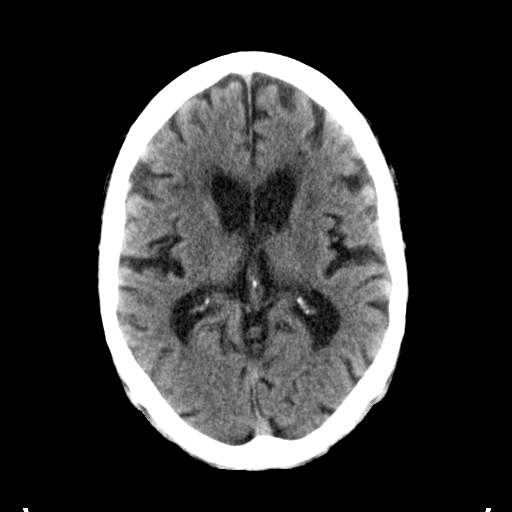
[im 17/35  brain]
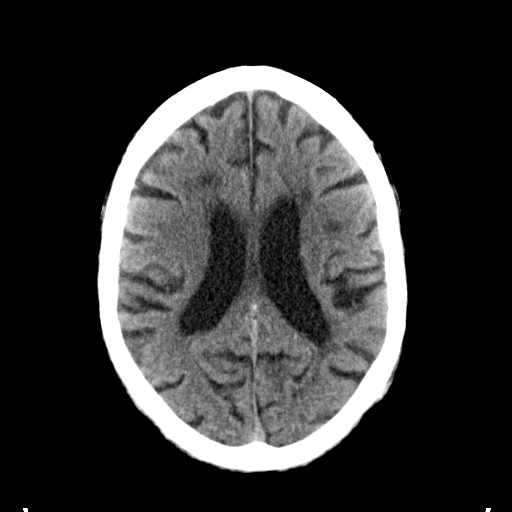
[im 18/35  brain]
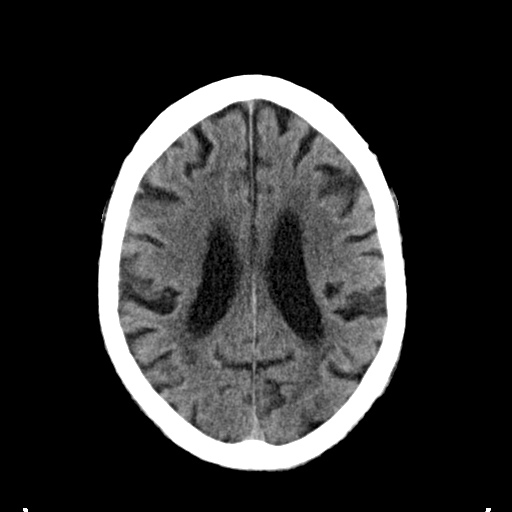
[im 18/35  bone]
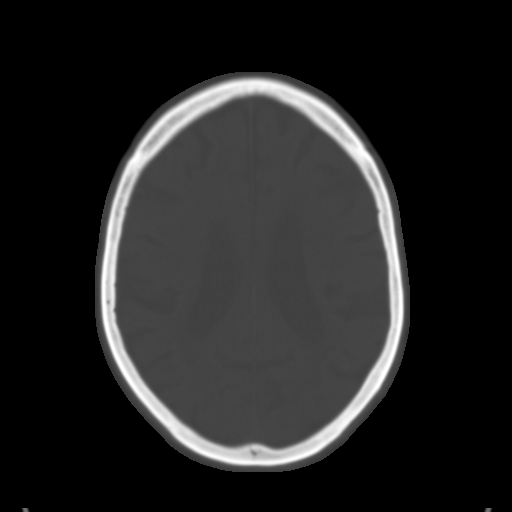
[im 20/35  brain]
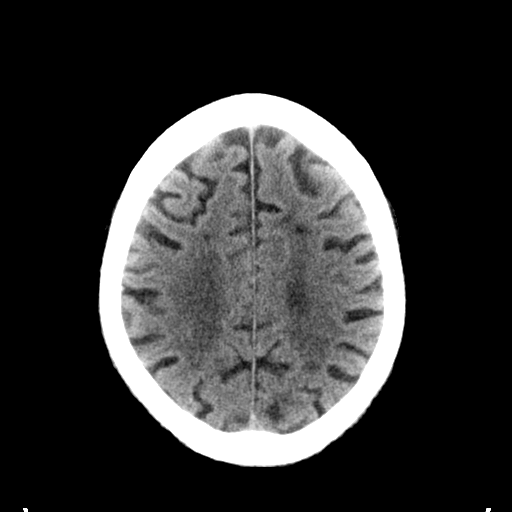
[im 23/35  brain]
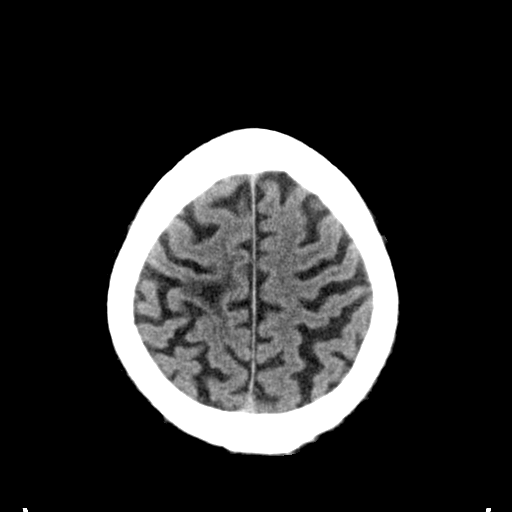
[im 25/35  brain]
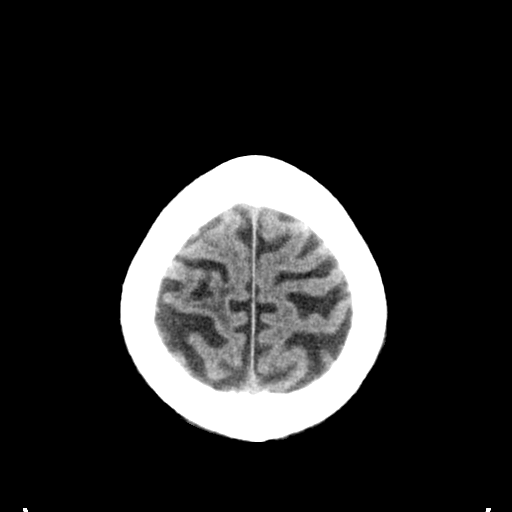
[im 26/35  brain]
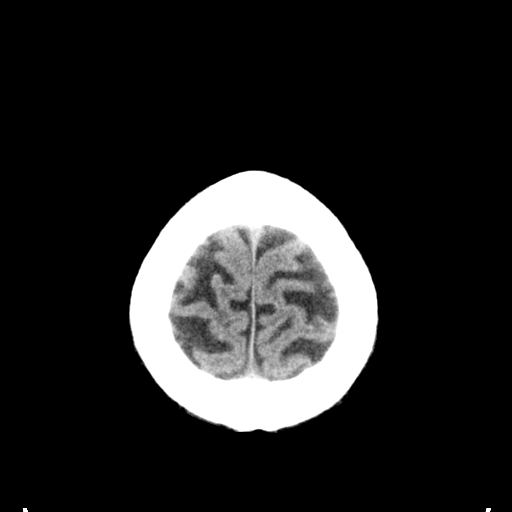
[im 26/35  bone]
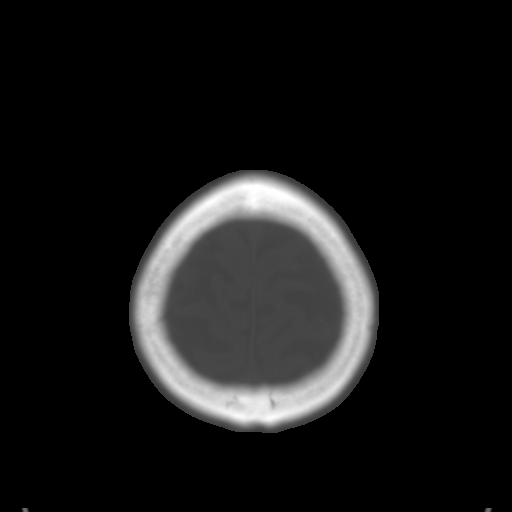
[im 29/35  brain]
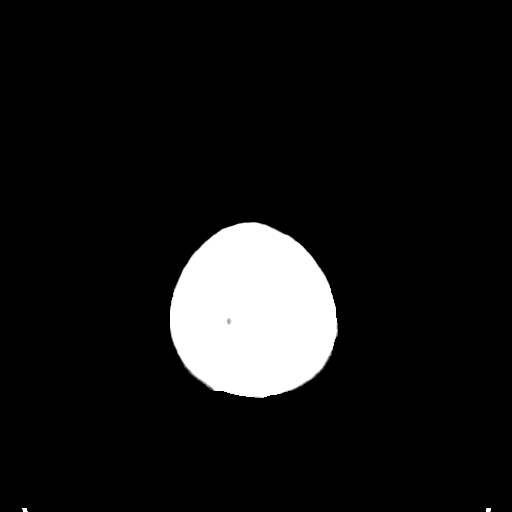
[im 31/35  brain]
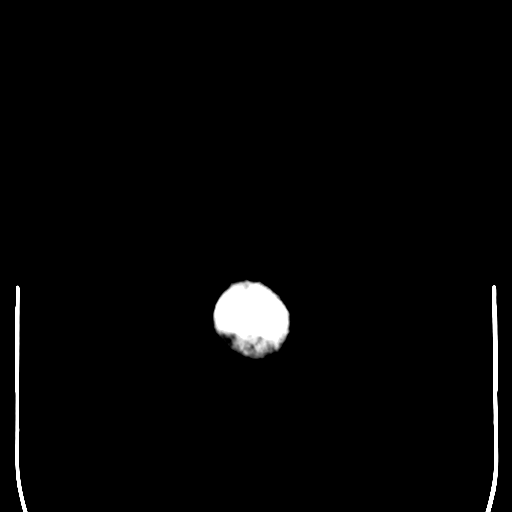
[im 33/35  brain]
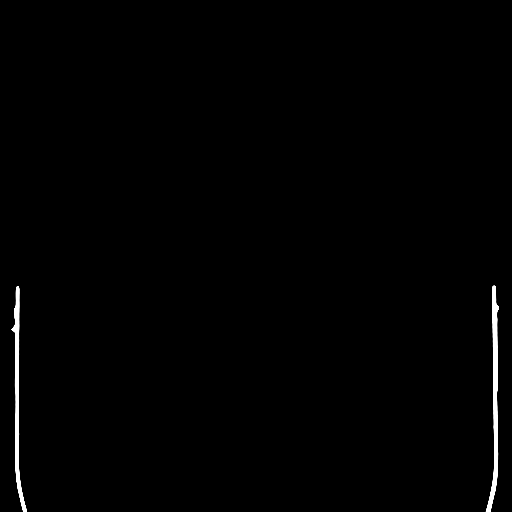

[16 of 30 positions shown; findings below may reference images not displayed]

FINDINGS: No evidence of an acute infarct, acute hemorrhage, mass lesion, mass
effect or hydrocephalus. Atrophy. Patchy chronic microvascular white
matter ischemic changes or remote infarcts. Ventricular size is
stable and likely in proportion to the degree of atrophy. Scattered
mucosal thickening in ethmoid air cells. No air-fluid levels in the
paranasal sinuses or mastoid air cells.
IMPRESSION: 1. No acute intracranial abnormality.
2. Atrophy and chronic microvascular white matter ischemic changes.

## 2015-08-09 ENCOUNTER — Emergency Department: Payer: Medicare Other

## 2015-08-09 ENCOUNTER — Inpatient Hospital Stay
Admission: EM | Admit: 2015-08-09 | Discharge: 2015-08-13 | DRG: 871 | Disposition: A | Discharge status: Skilled Nursing Facility | Payer: Medicare Other | Attending: Internal Medicine | Admitting: Internal Medicine

## 2015-08-09 ENCOUNTER — Encounter: Payer: Self-pay | Admitting: Internal Medicine

## 2015-08-09 DIAGNOSIS — J209 Acute bronchitis, unspecified: Secondary | ICD-10-CM | POA: Diagnosis present

## 2015-08-09 DIAGNOSIS — Z66 Do not resuscitate: Secondary | ICD-10-CM | POA: Diagnosis present

## 2015-08-09 DIAGNOSIS — R0603 Acute respiratory distress: Secondary | ICD-10-CM

## 2015-08-09 DIAGNOSIS — J189 Pneumonia, unspecified organism: Secondary | ICD-10-CM

## 2015-08-09 DIAGNOSIS — Z794 Long term (current) use of insulin: Secondary | ICD-10-CM

## 2015-08-09 DIAGNOSIS — F0150 Vascular dementia without behavioral disturbance: Secondary | ICD-10-CM | POA: Diagnosis present

## 2015-08-09 DIAGNOSIS — E1122 Type 2 diabetes mellitus with diabetic chronic kidney disease: Secondary | ICD-10-CM | POA: Diagnosis present

## 2015-08-09 DIAGNOSIS — Z8673 Personal history of transient ischemic attack (TIA), and cerebral infarction without residual deficits: Secondary | ICD-10-CM

## 2015-08-09 DIAGNOSIS — N179 Acute kidney failure, unspecified: Secondary | ICD-10-CM | POA: Diagnosis present

## 2015-08-09 DIAGNOSIS — I4581 Long QT syndrome: Secondary | ICD-10-CM | POA: Diagnosis present

## 2015-08-09 DIAGNOSIS — I429 Cardiomyopathy, unspecified: Secondary | ICD-10-CM | POA: Diagnosis present

## 2015-08-09 DIAGNOSIS — Z79899 Other long term (current) drug therapy: Secondary | ICD-10-CM

## 2015-08-09 DIAGNOSIS — I481 Persistent atrial fibrillation: Secondary | ICD-10-CM | POA: Diagnosis present

## 2015-08-09 DIAGNOSIS — J811 Chronic pulmonary edema: Secondary | ICD-10-CM

## 2015-08-09 DIAGNOSIS — N184 Chronic kidney disease, stage 4 (severe): Secondary | ICD-10-CM | POA: Diagnosis present

## 2015-08-09 DIAGNOSIS — R7989 Other specified abnormal findings of blood chemistry: Secondary | ICD-10-CM | POA: Diagnosis present

## 2015-08-09 DIAGNOSIS — G4730 Sleep apnea, unspecified: Secondary | ICD-10-CM | POA: Diagnosis present

## 2015-08-09 DIAGNOSIS — E1165 Type 2 diabetes mellitus with hyperglycemia: Secondary | ICD-10-CM | POA: Diagnosis present

## 2015-08-09 DIAGNOSIS — I130 Hypertensive heart and chronic kidney disease with heart failure and stage 1 through stage 4 chronic kidney disease, or unspecified chronic kidney disease: Secondary | ICD-10-CM | POA: Diagnosis present

## 2015-08-09 DIAGNOSIS — R778 Other specified abnormalities of plasma proteins: Secondary | ICD-10-CM | POA: Diagnosis present

## 2015-08-09 DIAGNOSIS — I4891 Unspecified atrial fibrillation: Secondary | ICD-10-CM | POA: Diagnosis present

## 2015-08-09 DIAGNOSIS — J440 Chronic obstructive pulmonary disease with acute lower respiratory infection: Secondary | ICD-10-CM | POA: Diagnosis present

## 2015-08-09 DIAGNOSIS — N189 Chronic kidney disease, unspecified: Secondary | ICD-10-CM

## 2015-08-09 DIAGNOSIS — I482 Chronic atrial fibrillation: Secondary | ICD-10-CM | POA: Diagnosis present

## 2015-08-09 DIAGNOSIS — Y95 Nosocomial condition: Secondary | ICD-10-CM | POA: Diagnosis present

## 2015-08-09 DIAGNOSIS — R748 Abnormal levels of other serum enzymes: Secondary | ICD-10-CM | POA: Diagnosis present

## 2015-08-09 DIAGNOSIS — R9431 Abnormal electrocardiogram [ECG] [EKG]: Secondary | ICD-10-CM | POA: Diagnosis present

## 2015-08-09 DIAGNOSIS — I509 Heart failure, unspecified: Secondary | ICD-10-CM | POA: Diagnosis present

## 2015-08-09 DIAGNOSIS — R0902 Hypoxemia: Secondary | ICD-10-CM

## 2015-08-09 DIAGNOSIS — N289 Disorder of kidney and ureter, unspecified: Secondary | ICD-10-CM

## 2015-08-09 DIAGNOSIS — E119 Type 2 diabetes mellitus without complications: Secondary | ICD-10-CM

## 2015-08-09 DIAGNOSIS — J9621 Acute and chronic respiratory failure with hypoxia: Secondary | ICD-10-CM | POA: Diagnosis present

## 2015-08-09 DIAGNOSIS — A419 Sepsis, unspecified organism: Principal | ICD-10-CM | POA: Diagnosis present

## 2015-08-09 HISTORY — DX: Unspecified atrial fibrillation: I48.91

## 2015-08-09 HISTORY — DX: Type 2 diabetes mellitus without complications: E11.9

## 2015-08-09 HISTORY — DX: Chronic respiratory failure with hypoxia: J96.11

## 2015-08-09 HISTORY — DX: Chronic obstructive pulmonary disease, unspecified: J44.9

## 2015-08-09 HISTORY — DX: Cerebral infarction, unspecified: I63.9

## 2015-08-09 HISTORY — DX: Essential (primary) hypertension: I10

## 2015-08-09 HISTORY — DX: Vascular dementia, unspecified severity, without behavioral disturbance, psychotic disturbance, mood disturbance, and anxiety: F01.50

## 2015-08-09 LAB — COMPREHENSIVE METABOLIC PANEL
ALT: 17 U/L (ref 17–63)
AST: 23 U/L (ref 15–41)
Albumin: 3.2 g/dL — ABNORMAL LOW (ref 3.5–5.0)
Alkaline Phosphatase: 55 U/L (ref 38–126)
Anion gap: 5 (ref 5–15)
BUN: 46 mg/dL — AB (ref 6–20)
CHLORIDE: 112 mmol/L — AB (ref 101–111)
CO2: 24 mmol/L (ref 22–32)
Calcium: 8.8 mg/dL — ABNORMAL LOW (ref 8.9–10.3)
Creatinine, Ser: 2.41 mg/dL — ABNORMAL HIGH (ref 0.61–1.24)
GFR, EST AFRICAN AMERICAN: 27 mL/min — AB (ref >60)
GFR, EST NON AFRICAN AMERICAN: 23 mL/min — AB (ref >60)
Glucose, Bld: 137 mg/dL — ABNORMAL HIGH (ref 65–99)
POTASSIUM: 4.7 mmol/L (ref 3.5–5.1)
SODIUM: 141 mmol/L (ref 135–145)
Total Bilirubin: 0.7 mg/dL (ref 0.3–1.2)
Total Protein: 7.6 g/dL (ref 6.5–8.1)

## 2015-08-09 LAB — CBC WITH DIFFERENTIAL/PLATELET
BASOS ABS: 0 10*3/uL (ref 0–0.1)
Basophils Relative: 1 %
Eosinophils Absolute: 0.2 10*3/uL (ref 0–0.7)
Eosinophils Relative: 2 %
HEMATOCRIT: 37.7 % — AB (ref 40.0–52.0)
Hemoglobin: 12.2 g/dL — ABNORMAL LOW (ref 13.0–18.0)
LYMPHS ABS: 2.5 10*3/uL (ref 1.0–3.6)
LYMPHS PCT: 31 %
MCH: 25.8 pg — AB (ref 26.0–34.0)
MCHC: 32.4 g/dL (ref 32.0–36.0)
MCV: 79.6 fL — AB (ref 80.0–100.0)
Monocytes Absolute: 1.1 10*3/uL — ABNORMAL HIGH (ref 0.2–1.0)
Monocytes Relative: 14 %
NEUTROS ABS: 4.1 10*3/uL (ref 1.4–6.5)
Neutrophils Relative %: 52 %
Platelets: 149 10*3/uL — ABNORMAL LOW (ref 150–440)
RBC: 4.73 MIL/uL (ref 4.40–5.90)
RDW: 15.9 % — ABNORMAL HIGH (ref 11.5–14.5)
WBC: 7.8 10*3/uL (ref 3.8–10.6)

## 2015-08-09 LAB — LACTIC ACID, PLASMA: LACTIC ACID, VENOUS: 1.5 mmol/L (ref 0.5–2.0)

## 2015-08-09 LAB — TROPONIN I: TROPONIN I: 0.06 ng/mL — AB (ref <0.031)

## 2015-08-09 MED ORDER — DEXTROSE 5 % IV SOLN
500.0000 mg | Freq: Once | INTRAVENOUS | Status: AC
  Administered 2015-08-09: 500 mg via INTRAVENOUS
  Filled 2015-08-09: qty 500

## 2015-08-09 MED ORDER — FUROSEMIDE 10 MG/ML IJ SOLN
40.0000 mg | Freq: Once | INTRAMUSCULAR | Status: AC
  Administered 2015-08-09: 40 mg via INTRAVENOUS
  Filled 2015-08-09: qty 4

## 2015-08-09 MED ORDER — SODIUM CHLORIDE 0.9 % IV BOLUS (SEPSIS)
1000.0000 mL | Freq: Once | INTRAVENOUS | Status: AC
  Administered 2015-08-09: 1000 mL via INTRAVENOUS

## 2015-08-09 MED ORDER — DEXTROSE 5 % IV SOLN
1.0000 g | Freq: Once | INTRAVENOUS | Status: AC
  Administered 2015-08-09: 1 g via INTRAVENOUS
  Filled 2015-08-09: qty 10

## 2015-08-09 NOTE — ED Provider Notes (Signed)
[facility] Emergency Department Provider Note   ____________________________________________  Time seen: ~2020  I have reviewed the triage vital signs and the nursing notes.   HISTORY  Chief Complaint Respiratory Distress   History limited by: Dementia   HPI John NortonJames Thompson is a 82 y.o. male with history of dementia who presents to the emergency department today via EMS because of concerns for shortness of breath.Per report the patient was diagnosed with bronchitis today. He then continued to develop worsening respiratory distress. For EMS the patient's initial oxygen saturation on room air was 86%. He is not on baseline oxygen. EMS gave the patient Solu-Medrol and 3 DuoNeb treatments. Additionally they started patient on magnesium and placed patient on CPAP. The patient himself suffers from dementia and is unable to give any significant history.     Past Medical History  Diagnosis Date  . Vascular dementia   . CVA (cerebral infarction)   . Hypertension   . Diabetes mellitus (HCC)     There are no active problems to display for this patient.   No past surgical history on file.  Current Outpatient Rx  Name  Route  Sig  Dispense  Refill  . acetaminophen (TYLENOL) 500 MG tablet   Oral   Take 1,000 mg by mouth every 4 (four) hours as needed.         . ALPRAZolam (XANAX) 0.5 MG tablet   Oral   Take 0.5 mg by mouth 2 (two) times daily as needed for anxiety.         Marland Kitchen. amoxicillin-clavulanate (AUGMENTIN) 500-125 MG tablet   Oral   Take 1 tablet by mouth every 12 (twelve) hours.         . carvedilol (COREG) 6.25 MG tablet   Oral   Take 6.25 mg by mouth 2 (two) times daily with a meal.         . divalproex (DEPAKOTE ER) 250 MG 24 hr tablet   Oral   Take 250 mg by mouth 2 (two) times daily.         . finasteride (PROSCAR) 5 MG tablet   Oral   Take 5 mg by mouth daily.         . furosemide (LASIX) 20 MG tablet   Oral   Take 20 mg  by mouth daily.         Marland Kitchen. guaiFENesin-dextromethorphan (ROBITUSSIN DM) 100-10 MG/5ML syrup   Oral   Take 10 mLs by mouth every 4 (four) hours as needed for cough.         . hydrocortisone (ANUSOL-HC) 25 MG suppository   Rectal   Place 25 mg rectally at bedtime as needed for hemorrhoids or itching.         . insulin glargine (LANTUS) 100 UNIT/ML injection   Subcutaneous   Inject 18 Units into the skin daily.         . insulin lispro (HUMALOG) 100 UNIT/ML injection   Subcutaneous   Inject 2-9 Units into the skin 3 (three) times daily before meals. Per sliding scale 0-99 0 units 100-149 2 units 150-199 3 units 200-249 4 units 250-299 5 units 300-349 6 units 350-399 7 units 400-449 8 units 450-499 9 units 500 or greater call MD         . ipratropium-albuterol (DUONEB) 0.5-2.5 (3) MG/3ML SOLN   Nebulization   Take 3 mLs by nebulization 3 (three) times daily.         Marland Kitchen. lisinopril (PRINIVIL,ZESTRIL) 2.5  MG tablet   Oral   Take 2.5 mg by mouth daily.         . medroxyPROGESTERone (PROVERA) 5 MG tablet   Oral   Take 5 mg by mouth daily.         . montelukast (SINGULAIR) 10 MG tablet   Oral   Take 10 mg by mouth at bedtime.         Marland Kitchen PARoxetine (PAXIL) 10 MG tablet   Oral   Take 10 mg by mouth daily.         . polyethylene glycol (MIRALAX / GLYCOLAX) packet   Oral   Take 17 g by mouth every other day.         . polyvinyl alcohol (LIQUIFILM TEARS) 1.4 % ophthalmic solution   Both Eyes   Place 2 drops into both eyes as needed for dry eyes.         Marland Kitchen risperiDONE (RISPERDAL) 0.5 MG tablet   Oral   Take 0.5 mg by mouth at bedtime.         . Rivaroxaban (XARELTO) 15 MG TABS tablet   Oral   Take 15 mg by mouth daily with supper.         . simvastatin (ZOCOR) 20 MG tablet   Oral   Take 20 mg by mouth daily.         . Skin Protectants, Misc. (EUCERIN) cream   Topical   Apply 1 application topically as needed for dry skin.         Marland Kitchen  spironolactone (ALDACTONE) 25 MG tablet   Oral   Take 12.5 mg by mouth daily.         . tamsulosin (FLOMAX) 0.4 MG CAPS capsule   Oral   Take 0.4 mg by mouth daily.           Allergies Review of patient's allergies indicates no known allergies.  Family History  Problem Relation Age of Onset  . Dementia      Social History Lives at nursing facility   Review of Systems Unable to obtain secondary to dementia ____________________________________________   PHYSICAL EXAM:  VITAL SIGNS:     100.5 F (38.1 C)   112   31  115/67 mmHg  100 %      Constitutional: Awake and alert, in moderate respiratory distress, on bipap Eyes: Conjunctivae are normal. PERRL. Normal extraocular movements. ENT   Head: Normocephalic and atraumatic.   Nose: No congestion/rhinnorhea.   Mouth/Throat: Mucous membranes are moist.   Neck: No stridor. Hematological/Lymphatic/Immunilogical: No cervical lymphadenopathy. Cardiovascular: tachycardic rhythm.  No murmurs, rubs, or gallops. Respiratory: Increased respiratory effort, on bipap, diffuse rhonchi. Gastrointestinal: Soft and nontender. No distention.  Genitourinary: Deferred Musculoskeletal: Normal range of motion in all extremities. No joint effusions.  No lower extremity tenderness nor edema. Neurologic:  Awake and alert, not oriented, appears to move all extremities Skin:  Skin is warm, dry and intact. No rash noted.  ____________________________________________    LABS (pertinent positives/negatives)  Labs Reviewed  COMPREHENSIVE METABOLIC PANEL - Abnormal; Notable for the following:    Chloride 112 (*)    Glucose, Bld 137 (*)    BUN 46 (*)    Creatinine, Ser 2.41 (*)    Calcium 8.8 (*)    Albumin 3.2 (*)    GFR calc non Af Amer 23 (*)    GFR calc Af Amer 27 (*)    All other components within normal limits  TROPONIN I -  Abnormal; Notable for the following:    Troponin I 0.06 (*)    All other components within  normal limits  CBC WITH DIFFERENTIAL/PLATELET - Abnormal; Notable for the following:    Hemoglobin 12.2 (*)    HCT 37.7 (*)    MCV 79.6 (*)    MCH 25.8 (*)    RDW 15.9 (*)    Platelets 149 (*)    Monocytes Absolute 1.1 (*)    All other components within normal limits  CULTURE, BLOOD (ROUTINE X 2)  CULTURE, BLOOD (ROUTINE X 2)  URINE CULTURE  LACTIC ACID, PLASMA  LACTIC ACID, PLASMA  URINALYSIS COMPLETEWITH MICROSCOPIC ([facility] ONLY)     ____________________________________________   EKG  I, Phineas Semen, attending physician, personally viewed and interpreted this EKG  EKG Time: 2027 Rate: 105 Rhythm: atrial fibrillation Axis: left axis deviation Intervals: qtc 514 QRS: narrow ST changes: no st elevation Impression: abnormal ekg   ____________________________________________    RADIOLOGY  CXR IMPRESSION: 1. Cardiomegaly and pulmonary edema, most consistent with CHF. 2. More linear opacities in the right perihilar and infrahilar lung, may reflect atelectasis, asymmetric pulmonary vasculature, or superimposed pneumonia.  ____________________________________________   PROCEDURES  Procedure(s) performed: None  Critical Care performed: Yes, see critical care note(s)  CRITICAL CARE Performed by: Phineas Semen   Total critical care time:  30  minutes  Critical care time was exclusive of separately billable procedures and treating other patients.  Critical care was necessary to treat or prevent imminent or life-threatening deterioration.  Critical care was time spent personally by me on the following activities: development of treatment plan with patient and/or surrogate as well as nursing, discussions with consultants, evaluation of patient's response to treatment, examination of patient, obtaining history from patient or surrogate, ordering and performing treatments and interventions, ordering and review of laboratory studies, ordering and review of  radiographic studies, pulse oximetry and re-evaluation of patient's condition.  ____________________________________________   INITIAL IMPRESSION / ASSESSMENT AND PLAN / ED COURSE  Pertinent labs & imaging results that were available during my care of the patient were reviewed by me and considered in my medical decision making (see chart for details).  Patient presented to the emergency department today because of concerns for respiratory distress. Patient was initially on CPAP which was transferred to BiPAP upon arrival to the emergency department. Patient had diffuse rhonchi. X-rays concerning for possible pneumonia and pulmonary edema. Patient will be given broad-spectrum antibiotics. She will be admitted to the hospital service.  ____________________________________________   FINAL CLINICAL IMPRESSION(S) / ED DIAGNOSES  Final diagnoses:  Respiratory distress  Community acquired pneumonia     Phineas Semen, MD 08/09/15 2311

## 2015-08-09 NOTE — ED Notes (Signed)
[facility] of [facility] distress. EMS reported O2sat of 86% on room air with wheezing in right lung sound and little air movement in left lung sounds. Pt placed on CPAP and O2 sats came up to mid to upper 90s. Pt diagnosed with bronchitis today and recently diagnosed with pneumonia. Has hx of CHF, COPD. EMS gave pt 2 duo-nebs, 125mg  of solu-medrol, 2mg  of mag. Pt is DNR.

## 2015-08-10 DIAGNOSIS — I4891 Unspecified atrial fibrillation: Secondary | ICD-10-CM | POA: Diagnosis present

## 2015-08-10 DIAGNOSIS — G4730 Sleep apnea, unspecified: Secondary | ICD-10-CM | POA: Diagnosis present

## 2015-08-10 DIAGNOSIS — F0150 Vascular dementia without behavioral disturbance: Secondary | ICD-10-CM | POA: Diagnosis present

## 2015-08-10 DIAGNOSIS — J9621 Acute and chronic respiratory failure with hypoxia: Secondary | ICD-10-CM | POA: Diagnosis present

## 2015-08-10 DIAGNOSIS — J440 Chronic obstructive pulmonary disease with acute lower respiratory infection: Secondary | ICD-10-CM | POA: Diagnosis present

## 2015-08-10 DIAGNOSIS — Z8673 Personal history of transient ischemic attack (TIA), and cerebral infarction without residual deficits: Secondary | ICD-10-CM | POA: Diagnosis not present

## 2015-08-10 DIAGNOSIS — I481 Persistent atrial fibrillation: Secondary | ICD-10-CM | POA: Diagnosis present

## 2015-08-10 DIAGNOSIS — Z79899 Other long term (current) drug therapy: Secondary | ICD-10-CM | POA: Diagnosis not present

## 2015-08-10 DIAGNOSIS — I482 Chronic atrial fibrillation: Secondary | ICD-10-CM | POA: Diagnosis present

## 2015-08-10 DIAGNOSIS — R748 Abnormal levels of other serum enzymes: Secondary | ICD-10-CM | POA: Diagnosis present

## 2015-08-10 DIAGNOSIS — E1122 Type 2 diabetes mellitus with diabetic chronic kidney disease: Secondary | ICD-10-CM | POA: Diagnosis present

## 2015-08-10 DIAGNOSIS — E1165 Type 2 diabetes mellitus with hyperglycemia: Secondary | ICD-10-CM | POA: Diagnosis present

## 2015-08-10 DIAGNOSIS — I4581 Long QT syndrome: Secondary | ICD-10-CM | POA: Diagnosis present

## 2015-08-10 DIAGNOSIS — N184 Chronic kidney disease, stage 4 (severe): Secondary | ICD-10-CM | POA: Diagnosis present

## 2015-08-10 DIAGNOSIS — I429 Cardiomyopathy, unspecified: Secondary | ICD-10-CM | POA: Diagnosis present

## 2015-08-10 DIAGNOSIS — N179 Acute kidney failure, unspecified: Secondary | ICD-10-CM | POA: Diagnosis present

## 2015-08-10 DIAGNOSIS — J209 Acute bronchitis, unspecified: Secondary | ICD-10-CM | POA: Diagnosis present

## 2015-08-10 DIAGNOSIS — Z66 Do not resuscitate: Secondary | ICD-10-CM | POA: Diagnosis present

## 2015-08-10 DIAGNOSIS — Y95 Nosocomial condition: Secondary | ICD-10-CM | POA: Diagnosis present

## 2015-08-10 DIAGNOSIS — J189 Pneumonia, unspecified organism: Secondary | ICD-10-CM | POA: Diagnosis present

## 2015-08-10 DIAGNOSIS — I509 Heart failure, unspecified: Secondary | ICD-10-CM | POA: Diagnosis present

## 2015-08-10 DIAGNOSIS — I130 Hypertensive heart and chronic kidney disease with heart failure and stage 1 through stage 4 chronic kidney disease, or unspecified chronic kidney disease: Secondary | ICD-10-CM | POA: Diagnosis present

## 2015-08-10 DIAGNOSIS — A419 Sepsis, unspecified organism: Secondary | ICD-10-CM | POA: Diagnosis present

## 2015-08-10 DIAGNOSIS — Z794 Long term (current) use of insulin: Secondary | ICD-10-CM | POA: Diagnosis not present

## 2015-08-10 LAB — BASIC METABOLIC PANEL
Anion gap: 9 (ref 5–15)
BUN: 50 mg/dL — AB (ref 6–20)
CALCIUM: 8.6 mg/dL — AB (ref 8.9–10.3)
CO2: 20 mmol/L — ABNORMAL LOW (ref 22–32)
Chloride: 110 mmol/L (ref 101–111)
Creatinine, Ser: 2.38 mg/dL — ABNORMAL HIGH (ref 0.61–1.24)
GFR calc Af Amer: 27 mL/min — ABNORMAL LOW (ref >60)
GFR, EST NON AFRICAN AMERICAN: 23 mL/min — AB (ref >60)
GLUCOSE: 325 mg/dL — AB (ref 65–99)
POTASSIUM: 4.9 mmol/L (ref 3.5–5.1)
Sodium: 139 mmol/L (ref 135–145)

## 2015-08-10 LAB — LIPID PANEL
CHOL/HDL RATIO: 3.4 ratio
CHOLESTEROL: 117 mg/dL (ref 0–200)
HDL: 34 mg/dL — AB (ref >40)
LDL Cholesterol: 63 mg/dL (ref 0–99)
TRIGLYCERIDES: 101 mg/dL (ref <150)
VLDL: 20 mg/dL (ref 0–40)

## 2015-08-10 LAB — URINALYSIS COMPLETE WITH MICROSCOPIC (ARMC ONLY)
Bacteria, UA: NONE SEEN
Bilirubin Urine: NEGATIVE
GLUCOSE, UA: NEGATIVE mg/dL
Ketones, ur: NEGATIVE mg/dL
Leukocytes, UA: NEGATIVE
Nitrite: NEGATIVE
Protein, ur: NEGATIVE mg/dL
Specific Gravity, Urine: 1.008 (ref 1.005–1.030)
WBC, UA: NONE SEEN WBC/hpf (ref 0–5)
pH: 5 (ref 5.0–8.0)

## 2015-08-10 LAB — TSH: TSH: 1.853 u[IU]/mL (ref 0.350–4.500)

## 2015-08-10 LAB — INFLUENZA PANEL BY PCR (TYPE A & B)
H1N1 flu by pcr: NOT DETECTED
INFLAPCR: NEGATIVE
INFLBPCR: NEGATIVE

## 2015-08-10 LAB — TROPONIN I
Troponin I: 0.05 ng/mL — ABNORMAL HIGH (ref <0.031)
Troponin I: 0.03 ng/mL (ref <0.031)
Troponin I: 0.05 ng/mL — ABNORMAL HIGH (ref <0.031)

## 2015-08-10 LAB — PROTIME-INR
INR: 1.2
PROTHROMBIN TIME: 15.4 s — AB (ref 11.4–15.0)

## 2015-08-10 LAB — MAGNESIUM: Magnesium: 2.7 mg/dL — ABNORMAL HIGH (ref 1.7–2.4)

## 2015-08-10 LAB — HEMOGLOBIN A1C: Hgb A1c MFr Bld: 9.2 % — ABNORMAL HIGH (ref 4.0–6.0)

## 2015-08-10 LAB — GLUCOSE, CAPILLARY
GLUCOSE-CAPILLARY: 418 mg/dL — AB (ref 65–99)
GLUCOSE-CAPILLARY: 384 mg/dL — AB (ref 65–99)
Glucose-Capillary: 415 mg/dL — ABNORMAL HIGH (ref 65–99)
Glucose-Capillary: 429 mg/dL — ABNORMAL HIGH (ref 65–99)

## 2015-08-10 LAB — LACTIC ACID, PLASMA: Lactic Acid, Venous: 1.1 mmol/L (ref 0.5–2.0)

## 2015-08-10 LAB — MRSA PCR SCREENING: MRSA BY PCR: POSITIVE — AB

## 2015-08-10 MED ORDER — GUAIFENESIN-DM 100-10 MG/5ML PO SYRP: 10.0000 mL | ORAL_SOLUTION | ORAL | Status: DC | PRN

## 2015-08-10 MED ORDER — INSULIN ASPART 100 UNIT/ML ~~LOC~~ SOLN
0.0000 [IU] | Freq: Three times a day (TID) | SUBCUTANEOUS | Status: DC
  Administered 2015-08-10: 15 [IU] via SUBCUTANEOUS
  Filled 2015-08-10: qty 15
  Filled 2015-08-10: qty 17

## 2015-08-10 MED ORDER — ACETAMINOPHEN 325 MG PO TABS: 650.0000 mg | ORAL_TABLET | ORAL | Status: DC | PRN

## 2015-08-10 MED ORDER — DEXTROSE 5 % IV SOLN
1.0000 g | INTRAVENOUS | Status: DC
  Administered 2015-08-10 – 2015-08-13 (×4): 1 g via INTRAVENOUS
  Filled 2015-08-10 (×5): qty 10

## 2015-08-10 MED ORDER — VANCOMYCIN HCL IN DEXTROSE 1-5 GM/200ML-% IV SOLN
1000.0000 mg | Freq: Once | INTRAVENOUS | Status: AC
  Administered 2015-08-10: 1000 mg via INTRAVENOUS
  Filled 2015-08-10: qty 200

## 2015-08-10 MED ORDER — INSULIN GLARGINE 100 UNIT/ML ~~LOC~~ SOLN
7.0000 [IU] | Freq: Once | SUBCUTANEOUS | Status: AC
  Administered 2015-08-10: 7 [IU] via SUBCUTANEOUS
  Filled 2015-08-10: qty 0.07

## 2015-08-10 MED ORDER — TAMSULOSIN HCL 0.4 MG PO CAPS
0.4000 mg | ORAL_CAPSULE | Freq: Every day | ORAL | Status: DC
  Administered 2015-08-11 – 2015-08-13 (×3): 0.4 mg via ORAL
  Filled 2015-08-10 (×3): qty 1

## 2015-08-10 MED ORDER — INSULIN ASPART 100 UNIT/ML ~~LOC~~ SOLN
17.0000 [IU] | Freq: Once | SUBCUTANEOUS | Status: AC
  Administered 2015-08-10: 17 [IU] via SUBCUTANEOUS

## 2015-08-10 MED ORDER — INSULIN ASPART 100 UNIT/ML ~~LOC~~ SOLN
9.0000 [IU] | Freq: Once | SUBCUTANEOUS | Status: AC
  Administered 2015-08-10: 9 [IU] via SUBCUTANEOUS
  Filled 2015-08-10: qty 9

## 2015-08-10 MED ORDER — ALPRAZOLAM 0.25 MG PO TABS: 0.5000 mg | ORAL_TABLET | Freq: Two times a day (BID) | ORAL | Status: DC | PRN

## 2015-08-10 MED ORDER — HYDROCORTISONE ACETATE 25 MG RE SUPP
25.0000 mg | Freq: Every evening | RECTAL | Status: DC | PRN
Start: 2015-08-10 — End: 2015-08-13
  Filled 2015-08-10: qty 1

## 2015-08-10 MED ORDER — MEDROXYPROGESTERONE ACETATE 5 MG PO TABS
5.0000 mg | ORAL_TABLET | Freq: Every day | ORAL | Status: DC
  Administered 2015-08-11 – 2015-08-13 (×2): 5 mg via ORAL
  Filled 2015-08-10 (×3): qty 1

## 2015-08-10 MED ORDER — DIVALPROEX SODIUM ER 250 MG PO TB24
250.0000 mg | ORAL_TABLET | Freq: Two times a day (BID) | ORAL | Status: DC
  Administered 2015-08-11 – 2015-08-13 (×5): 250 mg via ORAL
  Filled 2015-08-10 (×6): qty 1

## 2015-08-10 MED ORDER — POLYVINYL ALCOHOL 1.4 % OP SOLN
2.0000 [drp] | OPHTHALMIC | Status: DC | PRN
  Filled 2015-08-10: qty 15

## 2015-08-10 MED ORDER — INSULIN ASPART 100 UNIT/ML ~~LOC~~ SOLN: 0.0000 [IU] | Freq: Every day | SUBCUTANEOUS | Status: DC

## 2015-08-10 MED ORDER — INSULIN ASPART 100 UNIT/ML ~~LOC~~ SOLN
0.0000 [IU] | Freq: Three times a day (TID) | SUBCUTANEOUS | Status: DC
  Administered 2015-08-11: 5 [IU] via SUBCUTANEOUS
  Administered 2015-08-11 (×3): 8 [IU] via SUBCUTANEOUS
  Administered 2015-08-12 (×2): 5 [IU] via SUBCUTANEOUS
  Administered 2015-08-12 (×2): 3 [IU] via SUBCUTANEOUS
  Administered 2015-08-13: 2 [IU] via SUBCUTANEOUS
  Administered 2015-08-13: 8 [IU] via SUBCUTANEOUS
  Administered 2015-08-13: 3 [IU] via SUBCUTANEOUS
  Filled 2015-08-10 (×3): qty 8
  Filled 2015-08-10: qty 3
  Filled 2015-08-10 (×2): qty 5
  Filled 2015-08-10 (×2): qty 3
  Filled 2015-08-10: qty 5
  Filled 2015-08-10: qty 2
  Filled 2015-08-10: qty 8

## 2015-08-10 MED ORDER — FUROSEMIDE 10 MG/ML IJ SOLN
60.0000 mg | Freq: Two times a day (BID) | INTRAMUSCULAR | Status: DC
  Administered 2015-08-10: 60 mg via INTRAVENOUS
  Filled 2015-08-10: qty 8

## 2015-08-10 MED ORDER — INSULIN GLARGINE 100 UNIT/ML ~~LOC~~ SOLN
25.0000 [IU] | Freq: Every day | SUBCUTANEOUS | Status: DC
  Administered 2015-08-11: 25 [IU] via SUBCUTANEOUS
  Filled 2015-08-10 (×3): qty 0.25

## 2015-08-10 MED ORDER — IPRATROPIUM-ALBUTEROL 0.5-2.5 (3) MG/3ML IN SOLN
3.0000 mL | RESPIRATORY_TRACT | Status: DC
  Administered 2015-08-10 – 2015-08-11 (×5): 3 mL via RESPIRATORY_TRACT
  Filled 2015-08-10 (×5): qty 3

## 2015-08-10 MED ORDER — PAROXETINE HCL 10 MG PO TABS
10.0000 mg | ORAL_TABLET | Freq: Every day | ORAL | Status: DC
  Administered 2015-08-11 – 2015-08-13 (×3): 10 mg via ORAL
  Filled 2015-08-10 (×3): qty 1

## 2015-08-10 MED ORDER — CARVEDILOL 6.25 MG PO TABS
6.2500 mg | ORAL_TABLET | Freq: Two times a day (BID) | ORAL | Status: DC
  Administered 2015-08-10 – 2015-08-12 (×4): 6.25 mg via ORAL
  Filled 2015-08-10 (×4): qty 1

## 2015-08-10 MED ORDER — MONTELUKAST SODIUM 10 MG PO TABS
10.0000 mg | ORAL_TABLET | Freq: Every day | ORAL | Status: DC
  Administered 2015-08-11 – 2015-08-12 (×2): 10 mg via ORAL
  Filled 2015-08-10 (×2): qty 1

## 2015-08-10 MED ORDER — DILTIAZEM HCL 100 MG IV SOLR
5.0000 mg/h | INTRAVENOUS | Status: DC
  Filled 2015-08-10: qty 100

## 2015-08-10 MED ORDER — DEXTROSE 5 % IV SOLN
100.0000 mg | Freq: Two times a day (BID) | INTRAVENOUS | Status: DC
  Administered 2015-08-10 – 2015-08-12 (×4): 100 mg via INTRAVENOUS
  Filled 2015-08-10 (×7): qty 100

## 2015-08-10 MED ORDER — SIMVASTATIN 20 MG PO TABS
20.0000 mg | ORAL_TABLET | Freq: Every day | ORAL | Status: DC
  Administered 2015-08-11 – 2015-08-13 (×3): 20 mg via ORAL
  Filled 2015-08-10 (×3): qty 1

## 2015-08-10 MED ORDER — SPIRONOLACTONE 25 MG PO TABS
12.5000 mg | ORAL_TABLET | Freq: Every day | ORAL | Status: DC
  Administered 2015-08-11 – 2015-08-13 (×3): 12.5 mg via ORAL
  Filled 2015-08-10 (×3): qty 1

## 2015-08-10 MED ORDER — FUROSEMIDE 10 MG/ML IJ SOLN
20.0000 mg | Freq: Two times a day (BID) | INTRAMUSCULAR | Status: DC
  Administered 2015-08-10 – 2015-08-13 (×6): 20 mg via INTRAVENOUS
  Filled 2015-08-10 (×6): qty 2

## 2015-08-10 MED ORDER — INSULIN GLARGINE 100 UNIT/ML ~~LOC~~ SOLN
18.0000 [IU] | Freq: Every day | SUBCUTANEOUS | Status: DC
  Filled 2015-08-10: qty 0.18

## 2015-08-10 MED ORDER — HYDROCERIN EX CREA
1.0000 "application " | TOPICAL_CREAM | CUTANEOUS | Status: DC | PRN
  Filled 2015-08-10: qty 113

## 2015-08-10 MED ORDER — FINASTERIDE 5 MG PO TABS
5.0000 mg | ORAL_TABLET | Freq: Every day | ORAL | Status: DC
  Administered 2015-08-11 – 2015-08-13 (×3): 5 mg via ORAL
  Filled 2015-08-10 (×3): qty 1

## 2015-08-10 MED ORDER — RIVAROXABAN 15 MG PO TABS
15.0000 mg | ORAL_TABLET | Freq: Every day | ORAL | Status: DC
  Administered 2015-08-10 – 2015-08-13 (×4): 15 mg via ORAL
  Filled 2015-08-10 (×4): qty 1

## 2015-08-10 MED ORDER — POLYETHYLENE GLYCOL 3350 17 G PO PACK
17.0000 g | PACK | ORAL | Status: DC
Start: 2015-08-10 — End: 2015-08-13

## 2015-08-10 MED ORDER — VANCOMYCIN HCL 10 G IV SOLR
1500.0000 mg | INTRAVENOUS | Status: DC
  Administered 2015-08-11: 1500 mg via INTRAVENOUS
  Filled 2015-08-10: qty 1500

## 2015-08-10 MED ORDER — LISINOPRIL 5 MG PO TABS
2.5000 mg | ORAL_TABLET | Freq: Every day | ORAL | Status: DC
  Administered 2015-08-11 – 2015-08-13 (×3): 2.5 mg via ORAL
  Filled 2015-08-10 (×4): qty 1

## 2015-08-10 NOTE — ED Notes (Signed)
Attempted to call report, RN in a room, will call back in 5 minutes

## 2015-08-10 NOTE — ED Notes (Signed)
Hospitalist paged for CBG of [phone]

## 2015-08-10 NOTE — ED Notes (Signed)
Spoke to hospitliast, order for 17 units insulin obtained

## 2015-08-10 NOTE — ED Notes (Signed)
Pt cleaned and changed  

## 2015-08-10 NOTE — Progress Notes (Signed)
Patient alert and oriented x2 (self and place). Oriented to room, unit, and call bell. Admission completed per family. No complaints at this time. Will cont to assess. Skin assessment verified by Jennette Dubinoll F, RN. Telemetry box verified by Boneta LucksJenny, NT. Trudee KusterBrandi R Mansfield

## 2015-08-10 NOTE — [place] FL2 (Signed)
[place] MEDICAID FL2 LEVEL OF CARE SCREENING TOOL     IDENTIFICATION  Patient Name: John Thompson Birthdate: Sep 12, 1928 Sex: male Admission Date (Current Location): 08/09/2015  [facility] and [place] Number:  [address]  ([id number]) Facility and Address:  [facility], [address] [place], [place] [address]      Provider Number: [id number]  Attending Physician Name and Address:  Altamese Dilling, MD  Relative Name and Phone Number:       Current Level of Care: Hospital Recommended Level of Care: Skilled Nursing Facility Prior Approval Number:    Date Approved/Denied:   PASRR Number:  [id number])  Discharge Plan: SNF    Current Diagnoses: Patient Active Problem List   Diagnosis Date Noted  . Atrial fibrillation with rapid ventricular response (HCC) 08/10/2015  . Acute on chronic respiratory failure with hypoxia (HCC) 08/09/2015  . CHF exacerbation (HCC) 08/09/2015  . Healthcare-associated pneumonia 08/09/2015  . Sepsis (HCC) 08/09/2015  . Elevated troponin 08/09/2015  . Prolonged Q-T interval on ECG 08/09/2015  . Atrial fibrillation with RVR (HCC) 08/09/2015  . Acute on chronic renal insufficiency (HCC) 08/09/2015  . Type 2 diabetes mellitus (HCC) 08/09/2015    Orientation RESPIRATION BLADDER Height & Weight     Self, Place  O2 (Nasal Cannula 2 (L/min)) Incontinent Weight: 203 lb (92.08 kg) Height:   (172.7 cm)  BEHAVIORAL SYMPTOMS/MOOD NEUROLOGICAL BOWEL NUTRITION STATUS   (None)  (None) Continent Diet (heart healthy/carb modified)  AMBULATORY STATUS COMMUNICATION OF NEEDS Skin   Limited Assist Verbally Normal                       Personal Care Assistance Level of Assistance  Bathing, Feeding, Dressing Bathing Assistance: Limited assistance Feeding assistance: Limited assistance Dressing Assistance: Limited assistance     Functional Limitations Info  Hearing, Sight, Speech Sight Info: Adequate Hearing Info:  Adequate Speech Info: Adequate    SPECIAL CARE FACTORS FREQUENCY                       Contractures      Additional Factors Info  Code Status, Allergies Code Status Info:  (DNR) Allergies Info:  (No Known Allergies)           Current Medications (08/10/2015):  This is the current hospital active medication list Current Facility-Administered Medications  Medication Dose Route Frequency Provider Last Rate Last Dose  . acetaminophen (TYLENOL) tablet 650 mg  650 mg Oral Q4H PRN Robley Fries, MD      . ALPRAZolam Prudy Feeler) tablet 0.5 mg  0.5 mg Oral BID PRN Robley Fries, MD      . carvedilol (COREG) tablet 6.25 mg  6.25 mg Oral BID WC Robley Fries, MD   6.25 mg at 08/10/15 1657  . cefTRIAXone (ROCEPHIN) 1 g in dextrose 5 % 50 mL IVPB  1 g Intravenous Q24H Robley Fries, MD      . divalproex (DEPAKOTE ER) 24 hr tablet 250 mg  250 mg Oral BID Robley Fries, MD   250 mg at 08/10/15 1101  . doxycycline (VIBRAMYCIN) 100 mg in dextrose 5 % 250 mL IVPB  100 mg Intravenous Q12H Robley Fries, MD   100 mg at 08/10/15 1657  . finasteride (PROSCAR) tablet 5 mg  5 mg Oral Daily Robley Fries, MD   5 mg at 08/10/15 1101  . furosemide (LASIX) injection 20 mg  20 mg Intravenous Q12H Altamese Dilling, MD      .  guaiFENesin-dextromethorphan (ROBITUSSIN DM) 100-10 MG/5ML syrup 10 mL  10 mL Oral Q4H PRN Robley FriesAbdullahi Oseni, MD      . hydrocerin (EUCERIN) cream 1 application  1 application Topical PRN Robley FriesAbdullahi Oseni, MD      . hydrocortisone (ANUSOL-HC) suppository 25 mg  25 mg Rectal QHS PRN Robley FriesAbdullahi Oseni, MD      . insulin aspart (novoLOG) injection 0-15 Units  0-15 Units Subcutaneous TID WC Robley FriesAbdullahi Oseni, MD   15 Units at 08/10/15 1657  . insulin aspart (novoLOG) injection 0-5 Units  0-5 Units Subcutaneous QHS Robley FriesAbdullahi Oseni, MD      . Melene Muller[START ON 08/11/2015] insulin glargine (LANTUS) injection 25 Units  25 Units Subcutaneous Daily Altamese DillingVaibhavkumar Ferris Tally, MD      . insulin  glargine (LANTUS) injection 7 Units  7 Units Subcutaneous Once Altamese DillingVaibhavkumar Pasco Marchitto, MD      . ipratropium-albuterol (DUONEB) 0.5-2.5 (3) MG/3ML nebulizer solution 3 mL  3 mL Nebulization Q4H Robley FriesAbdullahi Oseni, MD   3 mL at 08/10/15 1559  . lisinopril (PRINIVIL,ZESTRIL) tablet 2.5 mg  2.5 mg Oral Daily Robley FriesAbdullahi Oseni, MD   2.5 mg at 08/10/15 1148  . medroxyPROGESTERone (PROVERA) tablet 5 mg  5 mg Oral Daily Robley FriesAbdullahi Oseni, MD   5 mg at 08/10/15 1149  . montelukast (SINGULAIR) tablet 10 mg  10 mg Oral QHS Robley FriesAbdullahi Oseni, MD      . PARoxetine (PAXIL) tablet 10 mg  10 mg Oral Daily Robley FriesAbdullahi Oseni, MD   10 mg at 08/10/15 1149  . polyethylene glycol (MIRALAX / GLYCOLAX) packet 17 g  17 g Oral QODAY Robley FriesAbdullahi Oseni, MD   17 g at 08/10/15 1102  . polyvinyl alcohol (LIQUIFILM TEARS) 1.4 % ophthalmic solution 2 drop  2 drop Both Eyes PRN Robley FriesAbdullahi Oseni, MD      . Rivaroxaban (XARELTO) tablet 15 mg  15 mg Oral Q supper Robley FriesAbdullahi Oseni, MD   15 mg at 08/10/15 1657  . simvastatin (ZOCOR) tablet 20 mg  20 mg Oral Daily Robley FriesAbdullahi Oseni, MD   20 mg at 08/10/15 1102  . spironolactone (ALDACTONE) tablet 12.5 mg  12.5 mg Oral Daily Robley FriesAbdullahi Oseni, MD   12.5 mg at 08/10/15 1102  . tamsulosin (FLOMAX) capsule 0.4 mg  0.4 mg Oral Daily Robley FriesAbdullahi Oseni, MD   0.4 mg at 08/10/15 1102  . [START ON 08/11/2015] vancomycin (VANCOCIN) 1,500 mg in sodium chloride 0.9 % 500 mL IVPB  1,500 mg Intravenous Q36H Robley FriesAbdullahi Oseni, MD         Discharge Medications: Please see discharge summary for a list of discharge medications.  Relevant Imaging Results:  Relevant Lab Results:   Additional Information  (SSN [id number])  Verta Ellenhristina E Sunkins, LCSW

## 2015-08-10 NOTE — Progress Notes (Signed)
Patient gurgling and chocking when offered food and drink. NPO order in . DR. Willis aware. Speech consult pending. New order from Dr. Anne HahnWillis to hold the oral medications (Depokate and singulair) . CBG 429 mg/dL, Dr. Anne HahnWillis notified with a new order for sliding scale and one time dose of 9 units x 1 dose . Will continue to monitor.

## 2015-08-10 NOTE — ED Notes (Signed)
Pt resting in bed, mouth care performed

## 2015-08-10 NOTE — Progress Notes (Signed)
Pharmacy Antibiotic Note  John NortonJames Thompson is a 82 y.o. male admitted on 08/09/2015 with HCAP.  Pharmacy has been consulted for Vancomycin and Ceftriaxone dosing. Patient also ordered Doxycycline 100mg  IV q12h, MD discontinued azithromycin due to QT effects. Patient received Vancomycin 1g IV in the ED.  Plan: Vancomycin 1500 IV every 24 hours.  Goal trough 15-20 mcg/mL. Will check a trough level prior to 4th dose. Will order Ceftriaxone 1g IV q24h.  Height: 5\' 8"  (172.7 cm) Weight: 203 lb (92.08 kg) IBW/kg (Calculated) : 68.4  Temp (24hrs), Avg:100.5 F (38.1 C), Min:100.5 F (38.1 C), Max:100.5 F (38.1 C)   Recent Labs Lab 08/09/15 2037 08/10/15 0035 08/10/15 0445  WBC 7.8  --   --   CREATININE 2.41*  --  2.38*  LATICACIDVEN 1.5 1.1  --     Estimated Creatinine Clearance: 25.5 mL/min (by C-G formula based on Cr of 2.38).    No Known Allergies  Antimicrobials this admission: Vancomycin 4/4 >>  Ceftriaxone 4/4 >>  Doxycycline 4/4 >>  Dose adjustments this admission:  Microbiology results:  Thank you for allowing pharmacy to be a part of this patient's care.  John CuffLisa Chloee Tena, PharmD, BCPS 08/10/2015 2:11 PM

## 2015-08-10 NOTE — Progress Notes (Signed)
Patient had episode of choking while eating dinner, stable now with no complaints. Dr. Imogene Burnhen notified and speech consult placed. Patient also was afib RVR during this time, non sustained and Dr. Imogene Burnhen also notified of this. Trudee KusterBrandi R [place]

## 2015-08-10 NOTE — Progress Notes (Signed)
Patient CBG checked on arrival to floor, results 418. Dr. Elisabeth PigeonVachhani notified. No new orders, wants CBG checked again at 1630, and give sliding scale accordingly. Patient recently received 17 units of novolog in ED for CBG of 415. Trudee KusterBrandi R [place]

## 2015-08-10 NOTE — H&P (Signed)
[facility] [place] [facility]   PATIENT NAME: John Thompson    MR#:  [id number]  DATE OF BIRTH:  1928-08-10  DATE OF ADMISSION:  08/09/2015  PRIMARY CARE PHYSICIAN: Keane Police, MD   REQUESTING/REFERRING PHYSICIAN: Dr Derrill Kay  CHIEF COMPLAINT:   Chief Complaint  Patient presents with  . Respiratory Distress    HISTORY OF PRESENT ILLNESS: John Thompson  is a 82 y.o. male with a known history of diabetes, hypertension, CVA, vascular dementia, chronic respiratory failure with hypoxia, COPD and atrial fibrillation. He presents to the ED from a skilled nursing facility [facility])  due to shortness of breath. Patient is reported to have been having worsening shortness of breath in the last 1 week and has been very weak. At 5:30 PM this evening, he developed significant shortness of breath which resulted in massive facility calling EMS. Patient has a history of vascular dementia and is unable to provide any history. History is obtained from ED staff and patient's daughter. The patient is reported to have been having significant swelling for the last 3 weeks to 1 month and swelling was located in the abdomen and legs. He has had a decreased appetite and has been getting weaker. He developed a cough about one week ago and cough was productive of yellowish sputum and also developed some wheezes. At the nursing facility, a chest x-ray was obtained yesterday with unknown results. He was also said to have been diagnosed with acute bronchitis earlier today. Were unable to determine if patient had any orthopnea or PND. There is no history of recent travel or sick contacts. Influenza and pneumococcal vaccines are up-to-date. There is no reported palpitations, chest pain, nausea or vomiting and there is no change in bowel habits but patient is reported to have had an increased urine output in recent times with occasional incontinence. When EMS picked up the patient at  the skilled nursing facility, patient was significant for short of breath with an O2 sat of 86% and symmetrical was given as well as 3 doses of DuoNeb's.  On arrival in the ED, patient had a temperature 100.5 and was tachycardic at 112 with a respiratory rate of up to 31. CBC was unremarkable and CMP was significant for a BUN of 46 from baseline of 24 and creatinine of 2.41 from a baseline of 1.37. Troponin was elevated at 0.06 and lactic acid was negative. Urinalysis and culture pending. Chest x-ray showed cardiac megaly with pulmonary edema consistent with CHF with possible pneumonia. EKG showed A. fib with RVR. QT was prolonged at 514. Patient received azithromycin and Septra allergen in the ED and will be admitted due to acute on chronic respiratory failure with hypoxia, atrial fibrillation with RVR, prolonged QT, sepsis, healthcare associated pneumonia and acute on chronic renal failure.   He is DO NOT INTUBATE DO NOT RESUSCITATE  MEDICAL HISTORY:   Past Medical History  Diagnosis Date  . Vascular dementia   . CVA (cerebral infarction)   . Hypertension   . Diabetes mellitus (HCC)   . COPD (chronic obstructive pulmonary disease) (HCC)   . Chronic respiratory failure with hypoxia (HCC)   . Atrial fibrillation (HCC)     PAST SURGICAL HISTORY: No past surgical history on file.  SOCIAL HISTORY:  Social History  Substance Use Topics  . Smoking status: Not on file  . Smokeless tobacco: Not on file  . Alcohol Use: Not on file    FAMILY HISTORY:  Family  History  Problem Relation Age of Onset  . Dementia      DRUG ALLERGIES: No Known Allergies  REVIEW OF SYSTEMS:  Unable to provide ROS due to dementia  MEDICATIONS AT HOME:  Prior to Admission medications   Medication Sig Start Date End Date Taking? Authorizing Provider  acetaminophen (TYLENOL) 500 MG tablet Take 1,000 mg by mouth every 4 (four) hours as needed.   Yes Historical Provider, MD  ALPRAZolam Prudy Feeler(XANAX) 0.5 MG tablet Take  0.5 mg by mouth 2 (two) times daily as needed for anxiety.   Yes Historical Provider, MD  amoxicillin-clavulanate (AUGMENTIN) 500-125 MG tablet Take 1 tablet by mouth every 12 (twelve) hours.   Yes Historical Provider, MD  carvedilol (COREG) 6.25 MG tablet Take 6.25 mg by mouth 2 (two) times daily with a meal.   Yes Historical Provider, MD  divalproex (DEPAKOTE ER) 250 MG 24 hr tablet Take 250 mg by mouth 2 (two) times daily.   Yes Historical Provider, MD  finasteride (PROSCAR) 5 MG tablet Take 5 mg by mouth daily.   Yes Historical Provider, MD  furosemide (LASIX) 20 MG tablet Take 20 mg by mouth daily.   Yes Historical Provider, MD  guaiFENesin-dextromethorphan (ROBITUSSIN DM) 100-10 MG/5ML syrup Take 10 mLs by mouth every 4 (four) hours as needed for cough.   Yes Historical Provider, MD  hydrocortisone (ANUSOL-HC) 25 MG suppository Place 25 mg rectally at bedtime as needed for hemorrhoids or itching.   Yes Historical Provider, MD  insulin glargine (LANTUS) 100 UNIT/ML injection Inject 18 Units into the skin daily.   Yes Historical Provider, MD  insulin lispro (HUMALOG) 100 UNIT/ML injection Inject 2-9 Units into the skin 3 (three) times daily before meals. Per sliding scale 0-99 0 units 100-149 2 units 150-199 3 units 200-249 4 units 250-299 5 units 300-349 6 units 350-399 7 units 400-449 8 units 450-499 9 units 500 or greater call MD   Yes Historical Provider, MD  ipratropium-albuterol (DUONEB) 0.5-2.5 (3) MG/3ML SOLN Take 3 mLs by nebulization 3 (three) times daily.   Yes Historical Provider, MD  lisinopril (PRINIVIL,ZESTRIL) 2.5 MG tablet Take 2.5 mg by mouth daily.   Yes Historical Provider, MD  medroxyPROGESTERone (PROVERA) 5 MG tablet Take 5 mg by mouth daily.   Yes Historical Provider, MD  montelukast (SINGULAIR) 10 MG tablet Take 10 mg by mouth at bedtime.   Yes Historical Provider, MD  PARoxetine (PAXIL) 10 MG tablet Take 10 mg by mouth daily.   Yes Historical Provider, MD   polyethylene glycol (MIRALAX / GLYCOLAX) packet Take 17 g by mouth every other day.   Yes Historical Provider, MD  polyvinyl alcohol (LIQUIFILM TEARS) 1.4 % ophthalmic solution Place 2 drops into both eyes as needed for dry eyes.   Yes Historical Provider, MD  risperiDONE (RISPERDAL) 0.5 MG tablet Take 0.5 mg by mouth at bedtime.   Yes Historical Provider, MD  Rivaroxaban (XARELTO) 15 MG TABS tablet Take 15 mg by mouth daily with supper.   Yes Historical Provider, MD  simvastatin (ZOCOR) 20 MG tablet Take 20 mg by mouth daily.   Yes Historical Provider, MD  Skin Protectants, Misc. (EUCERIN) cream Apply 1 application topically as needed for dry skin.   Yes Historical Provider, MD  spironolactone (ALDACTONE) 25 MG tablet Take 12.5 mg by mouth daily.   Yes Historical Provider, MD  tamsulosin (FLOMAX) 0.4 MG CAPS capsule Take 0.4 mg by mouth daily.   Yes Historical Provider, MD  PHYSICAL EXAMINATION:   VITAL SIGNS: Blood pressure 116/81, pulse 96, temperature 100.5 F (38.1 C), temperature source Rectal, resp. rate 28, height  (1.727 m), weight 92.08 kg (203 lb), SpO2 100 %.  GENERAL:  82 y.o.-year-old patient lying in the bed with no acute distress. Alert and oriented x 1 EYES: Pupils equal, round, reactive to light and accommodation. No scleral icterus. Extraocular muscles intact.  HEENT: Head atraumatic, normocephalic. Oropharynx and nasopharynx clear.  NECK:  Supple, no jugular venous distention. No thyroid enlargement, no tenderness.  LUNGS:  Decreased entry in all lung sounds with minimal scattered wheezing. Crackles present in the lower and middle lung zones posteriorly. CARDIOVASCULAR:  Tachycardia, irregular, S1, S2 normal. No murmurs, rubs, or gallops.  ABDOMEN: Soft, nontender, nondistended. Bowel sounds present. No organomegaly or mass.  EXTREMITIES: 2+ pitting pedal edema bilaterally up to above the knees NEUROLOGIC: Cranial nerves II through XII are intact. Muscle  strength 5/5 in all extremities. Sensation intact. Gait not checked.   SKIN: No obvious rash, lesion, or ulcer.   LABORATORY PANEL:   CBC  Recent Labs Lab 08/09/15 2037  WBC 7.8  HGB 12.2*  HCT 37.7*  PLT 149*  MCV 79.6*  MCH 25.8*  MCHC 32.4  RDW 15.9*  LYMPHSABS 2.5  MONOABS 1.1*  EOSABS 0.2  BASOSABS 0.0   ------------------------------------------------------------------------------------------------------------------  Chemistries   Recent Labs Lab 08/09/15 2037  NA 141  K 4.7  CL 112*  CO2 24  GLUCOSE 137*  BUN 46*  CREATININE 2.41*  CALCIUM 8.8*  AST 23  ALT 17  ALKPHOS 55  BILITOT 0.7   ------------------------------------------------------------------------------------------------------------------ estimated creatinine clearance is 25.1 mL/min (by C-G formula based on Cr of 2.41). ------------------------------------------------------------------------------------------------------------------ No results for input(s): TSH, T4TOTAL, T3FREE, THYROIDAB in the last 72 hours.  Invalid input(s): FREET3   Coagulation profile No results for input(s): INR, PROTIME in the last 168 hours. ------------------------------------------------------------------------------------------------------------------- No results for input(s): DDIMER in the last 72 hours. -------------------------------------------------------------------------------------------------------------------  Cardiac Enzymes  Recent Labs Lab 08/09/15 2037  TROPONINI 0.06*   ------------------------------------------------------------------------------------------------------------------ Invalid input(s): POCBNP  ---------------------------------------------------------------------------------------------------------------  Urinalysis    Component Value Date/Time   COLORURINE Yellow 08/18/2014 0141   APPEARANCEUR Clear 08/18/2014 0141   LABSPEC 1.011 08/18/2014 0141   PHURINE 6.0  08/18/2014 0141   GLUCOSEU Negative 08/18/2014 0141   HGBUR Negative 08/18/2014 0141   BILIRUBINUR Negative 08/18/2014 0141   KETONESUR Negative 08/18/2014 0141   PROTEINUR Negative 08/18/2014 0141   NITRITE Negative 08/18/2014 0141   LEUKOCYTESUR Negative 08/18/2014 0141     RADIOLOGY: Dg Chest Port 1 View  08/09/2015  CLINICAL DATA:  Wheezing and respiratory distress. EXAM: PORTABLE CHEST 1 VIEW COMPARISON:  08/18/2014 FINDINGS: Lung volumes are low. Cardiomegaly, with mild increase in the interim. There is mild central pulmonary edema. More linear opacities are seen in the right perihilar and infrahilar lung. No large pleural effusion. No pneumothorax. IMPRESSION: 1. Cardiomegaly and pulmonary edema, most consistent with CHF. 2. More linear opacities in the right perihilar and infrahilar lung, may reflect atelectasis, asymmetric pulmonary vasculature, or superimposed pneumonia. Electronically Signed   By: Rubye Oaks M.D.   On: 08/09/2015 21:16    EKG: Orders placed or performed during the hospital encounter of 08/09/15  . EKG 12-Lead  . EKG 12-Lead    ASSESSMENT  Principal Problem:   Acute on chronic respiratory failure with hypoxia (HCC) Active Problems:   CHF exacerbation (HCC)   Healthcare-associated pneumonia   Sepsis (HCC)   Elevated troponin  Prolonged Q-T interval on ECG   Atrial fibrillation with RVR (HCC)   Acute on chronic renal insufficiency (HCC)   Type 2 diabetes mellitus (HCC)  PLAN   1). Acute respiratory failure with hypoxia secondary to CHF exacerbation with elevated troponin - patient presented with an acute worsening of his respiratory status. He was diagnosed with acute bronchitis prior. Chest x-ray is consistent with pulmonary edema. - Patient received 40 mg of Lasix in the ED. We'll continue 60 mg every 8 hours. - Continue to cycle troponin. Initial troponin was elevated at 0.06. - Continue supplementary oxygen, DuoNeb's - there is no  information on and 2-D echo in the chart. Obtain 2-D echo - continue spironolactone, lisinopril, Coreg and statin. - Measure daily weights, input and output and ensure fluid restriction  - Check lipid panel and A1c   2). Healthcare associated pneumonia with early sepsis - patient presents with clinical syndrome consistent with pneumonia and chest x-ray suggest pneumonia. - Continue patient on vancomycin, doxycycline and ceftriaxone. - Follow results of blood and sputum cultures and adjust antibiotics if necessary. - Continue supplementary oxygen, DuoNeb and guaifenesin - incentive spirometry   3). A. fib with RVR - patient has a baseline A. fib but has been going into tachycardia with up to 140 bpm. Start patient on Cardizem drip and titrate a heart rate less than 110. - Cycle cardiac enzymes and obtain 2-D echo - check TSH - Continue Rivaroxaban  4). Acute on chronic renal insufficiency - patient has a creatinine of 2.41 from a baseline of 1.37. This is most likely due to congestion. Continue diuresis and patient and monitor creatinine levels. - If creatinine fails to improve, consult nephrology.  5). Prolonged QT - QT is prolonged at 514. Patient is on risperidone and SSRI and was given azithromycin in the ED. - Discontinue risperidone and azithromycin and start patient on doxycycline in place of azithromycin. - Check magnesium level and monitor QT closely.  6). Type 2 diabetes mellitus - patient has a history of type 2 diabetes mellitus as is on Lantus and sliding scale insulin. We'll continue these. - Check A1c and place patient on consistent carbohydrate diet. - Monitor Accu-Cheks and adjust insulin doses accordingly  All the records are reviewed and case discussed with ED provider. Management plans discussed with the patient, family and they are in agreement.  CODE STATUS: Code Status History    This patient does not have a recorded code status. Please follow your  organizational policy for patients in this situation.      I have independently reviewed all EKG and chest x-ray data  VTE prophylaxis: On Rivaroxaban  Vaccinations: Pneumonia & flu vaccine per hospital protocol Prevention: Will proceed with conservative measures for the prevention of delirium in patients older than 65. Fall precautions and 1:1 sitter as needed per hospital protocol.  TOTAL CRITICAL TIME TAKING CARE OF THIS PATIENT: 60 minutes.    Robley Fries M.D on 08/10/2015 at 12:02 AM  Between 7am to 6pm - Pager - [phone]  After 6pm go to [email or web address] - password [email or web address] [facility]  [facility] [place]  Office  [phone]  CC: Primary care physician; Keane Police, MD

## 2015-08-10 NOTE — ED Notes (Signed)
Pt resting comfortably on 2L [place].

## 2015-08-10 NOTE — Progress Notes (Signed)
[facility] - [place] [facility]   PATIENT NAME: John Thompson    MR#:  [id number]  DATE OF BIRTH:  26-Sep-1928  SUBJECTIVE:  CHIEF COMPLAINT:   Chief Complaint  Patient presents with  . Respiratory Distress   Brought from NH for worsening mental status, and SOB.   Required Bipap initialy, much improved, still drowsy, but comfortable on nasal canula.   Daughter in room as main source of information.  REVIEW OF SYSTEMS:   Pt is drowsy and confused. Not able to get ROS.  ROS  DRUG ALLERGIES:  No Known Allergies  VITALS:  Blood pressure 168/87, pulse 84, temperature 97.1 F (36.2 C), temperature source Rectal, resp. rate 18, height 5\' 8"  (1.727 m), weight 92.08 kg (203 lb), SpO2 98 %.  PHYSICAL EXAMINATION:  GENERAL:  82 y.o.-year-old patient lying in the bed with no acute distress.  EYES: Pupils equal, round, reactive to light and accommodation. No scleral icterus. Extraocular muscles intact.  HEENT: Head atraumatic, normocephalic. Oropharynx and nasopharynx clear.  NECK:  Supple, no jugular venous distention. No thyroid enlargement, no tenderness.  LUNGS: Normal breath sounds bilaterally, mild wheezing, some crepitation. No use of accessory muscles of respiration. On nasal canula CARDIOVASCULAR: S1, S2 normal. No murmurs, rubs, or gallops.  ABDOMEN: Soft, nontender, nondistended. Bowel sounds present. No organomegaly or mass.  EXTREMITIES: No pedal edema, cyanosis, or clubbing.  NEUROLOGIC: Cranial nerves II through XII are intact. Muscle strength 3-4/5 in all extremities. Sensation intact. Gait not checked.  PSYCHIATRIC: The patient is drowsy but easily arousable.  SKIN: No obvious rash, lesion, or ulcer.   Physical Exam LABORATORY PANEL:   CBC  Recent Labs Lab 08/09/15 2037  WBC 7.8  HGB 12.2*  HCT 37.7*  PLT 149*   ------------------------------------------------------------------------------------------------------------------  Chemistries    Recent Labs Lab 08/09/15 2037 08/10/15 0445  NA 141 139  K 4.7 4.9  CL 112* 110  CO2 24 20*  GLUCOSE 137* 325*  BUN 46* 50*  CREATININE 2.41* 2.38*  CALCIUM 8.8* 8.6*  MG  --  2.7*  AST 23  --   ALT 17  --   ALKPHOS 55  --   BILITOT 0.7  --    ------------------------------------------------------------------------------------------------------------------  Cardiac Enzymes  Recent Labs Lab 08/10/15 0824 08/10/15 1302  TROPONINI 0.03 0.05*   ------------------------------------------------------------------------------------------------------------------  RADIOLOGY:  Dg Chest Port 1 View  08/09/2015  CLINICAL DATA:  Wheezing and respiratory distress. EXAM: PORTABLE CHEST 1 VIEW COMPARISON:  08/18/2014 FINDINGS: Lung volumes are low. Cardiomegaly, with mild increase in the interim. There is mild central pulmonary edema. More linear opacities are seen in the right perihilar and infrahilar lung. No large pleural effusion. No pneumothorax. IMPRESSION: 1. Cardiomegaly and pulmonary edema, most consistent with CHF. 2. More linear opacities in the right perihilar and infrahilar lung, may reflect atelectasis, asymmetric pulmonary vasculature, or superimposed pneumonia. Electronically Signed   By: Rubye OaksMelanie  Ehinger M.D.   On: 08/09/2015 21:16    ASSESSMENT AND PLAN:   Principal Problem:   Acute on chronic respiratory failure with hypoxia (HCC) Active Problems:   CHF exacerbation (HCC)   Healthcare-associated pneumonia   Sepsis (HCC)   Elevated troponin   Prolonged Q-T interval on ECG   Atrial fibrillation with RVR (HCC)   Acute on chronic renal insufficiency (HCC)   Type 2 diabetes mellitus (HCC)   Atrial fibrillation with rapid ventricular response (HCC)  1). Acute respiratory failure with hypoxia secondary to CHF exacerbation with elevated troponin -  He was diagnosed with acute bronchitis in NH. Chest x-ray is consistent with pulmonary edema. - Patient received 40 mg of  Lasix in the ED.  - very good response and no leg edema now as per daughter- much better. - will cont small dose lasix now. - stable follow up troponin. Initial troponin was elevated at 0.06. - Continue supplementary oxygen, DuoNeb's - there is no information on and 2-D echo in the chart. Obtain 2-D echo - continue spironolactone, lisinopril, Coreg and statin. - Measure daily weights, input and output and ensure fluid restriction  - Checked lipid panel and A1c   2). Healthcare associated pneumonia with early sepsis - patient presents with clinical syndrome consistent with pneumonia and chest x-ray suggest pneumonia. - Continue patient on vancomycin, doxycycline and ceftriaxone. - Follow results of blood and sputum cultures and adjust antibiotics if necessary. - Continue supplementary oxygen, DuoNeb and guaifenesin - incentive spirometry  - check MRSA PCR and influenza test- to adjust Abx tomorrow.  3). A. fib with RVR - patient has a baseline A. fib but has been going into tachycardia with up to 140 bpm.  - Slowed down spontaneously over few hours without requirement of Cardizem drip. - normal TSH - Continue Rivaroxaban - continue coreg.  4). Acute on chronic renal insufficiency - patient has a creatinine of 2.41 from a baseline of 1.37. This is most likely due to congestion. Continue diuresis and patient and monitor creatinine levels. - If creatinine fails to improve, consult nephrology.  5). Prolonged QT - QT is prolonged at 514. Patient is on risperidone and SSRI and was given azithromycin in the ED. - Discontinue risperidone and azithromycin and start patient on doxycycline in place of azithromycin. - normal magnesium level and monitor QT closely.  6). Type 2 diabetes mellitus - patient has a history of type 2 diabetes mellitus as is on Lantus and sliding scale insulin. We'll continue these. - High A1c and place patient on consistent carbohydrate diet. - Monitor Accu-Cheks  and adjust insulin doses accordingly - due to persistent Hyperglycemia increased lantus.    All the records are reviewed and case discussed with Care Management/Social Workerr. Management plans discussed with the patient, family and they are in agreement.  CODE STATUS: DNR  TOTAL TIME TAKING CARE OF THIS PATIENT: 35 minutes.  Discussed with daughter and Ex wife in room.   POSSIBLE D/C IN 35 DAYS, DEPENDING ON CLINICAL CONDITION.   Altamese Dilling M.D on 08/10/2015   Between 7am to 6pm - Pager - [phone]  After 6pm go to [email or web address] - password [email or web address]  [facility]  Office  [phone]  CC: Primary care physician; Keane Police, MD  Note: This dictation was prepared with Dragon dictation along with smaller phrase technology. Any transcriptional errors that result from this process are unintentional.

## 2015-08-10 NOTE — ED Notes (Signed)
Awaiting new room to be cleaned , 241

## 2015-08-11 ENCOUNTER — Inpatient Hospital Stay: Admit: 2015-08-11 | Payer: Medicare Other

## 2015-08-11 LAB — GLUCOSE, CAPILLARY
GLUCOSE-CAPILLARY: 276 mg/dL — AB (ref 65–99)
GLUCOSE-CAPILLARY: 285 mg/dL — AB (ref 65–99)
Glucose-Capillary: 272 mg/dL — ABNORMAL HIGH (ref 65–99)
Glucose-Capillary: 240 mg/dL — ABNORMAL HIGH (ref 65–99)

## 2015-08-11 LAB — CBC
HCT: 37.3 % — ABNORMAL LOW (ref 40.0–52.0)
Hemoglobin: 12.4 g/dL — ABNORMAL LOW (ref 13.0–18.0)
MCH: 25.9 pg — ABNORMAL LOW (ref 26.0–34.0)
MCHC: 33.1 g/dL (ref 32.0–36.0)
MCV: 78.2 fL — AB (ref 80.0–100.0)
PLATELETS: 155 10*3/uL (ref 150–440)
RBC: 4.78 MIL/uL (ref 4.40–5.90)
RDW: 16.2 % — AB (ref 11.5–14.5)
WBC: 7.9 10*3/uL (ref 3.8–10.6)

## 2015-08-11 LAB — BASIC METABOLIC PANEL
Anion gap: 8 (ref 5–15)
BUN: 76 mg/dL — AB (ref 6–20)
CHLORIDE: 111 mmol/L (ref 101–111)
CO2: 21 mmol/L — AB (ref 22–32)
Calcium: 8.2 mg/dL — ABNORMAL LOW (ref 8.9–10.3)
Creatinine, Ser: 2.4 mg/dL — ABNORMAL HIGH (ref 0.61–1.24)
GFR calc Af Amer: 27 mL/min — ABNORMAL LOW (ref >60)
GFR calc non Af Amer: 23 mL/min — ABNORMAL LOW (ref >60)
Glucose, Bld: 297 mg/dL — ABNORMAL HIGH (ref 65–99)
POTASSIUM: 4.8 mmol/L (ref 3.5–5.1)
SODIUM: 140 mmol/L (ref 135–145)

## 2015-08-11 MED ORDER — IPRATROPIUM-ALBUTEROL 0.5-2.5 (3) MG/3ML IN SOLN: 3.0000 mL | RESPIRATORY_TRACT | Status: DC | PRN

## 2015-08-11 NOTE — Care Management Note (Signed)
Case Management Note  Patient Details  Name: Merita NortonJames Womac MRN: [id number] Date of Birth: December 23, 1928  Subjective/Objective:   [facility]. Will assist as needed.                  Action/Plan:   Expected Discharge Date:                  Expected Discharge Plan:  Skilled Nursing Facility  In-House Referral:  Clinical Social Work  Discharge planning Services     Post Acute Care Choice:    Choice offered to:     DME Arranged:    DME Agency:     HH Arranged:    HH Agency:     Status of Service:  Completed, signed off  Medicare Important Message Given:    Date Medicare IM Given:    Medicare IM give by:    Date Additional Medicare IM Given:    Additional Medicare Important Message give by:     If discussed at Long Length of Stay Meetings, dates discussed:    Additional Comments:  Marily MemosLisa M Areli Jowett, RN 08/11/2015, 2:49 PM

## 2015-08-11 NOTE — Progress Notes (Signed)
Per Natalia LeatherwoodKatherine, SLP, pt can take pills whole with applesauce.

## 2015-08-11 NOTE — Evaluation (Signed)
Clinical/Bedside Swallow Evaluation Patient Details  Name: John Thompson MRN: [id number] Date of Birth: 1929-04-26  Today's Date: 08/11/2015 Time: SLP Start Time (ACUTE ONLY): 0915 SLP Stop Time (ACUTE ONLY): 1010 SLP Time Calculation (min) (ACUTE ONLY): 55 min  Past Medical History:  Past Medical History  Diagnosis Date  . Vascular dementia   . CVA (cerebral infarction)   . Hypertension   . Diabetes mellitus (HCC)   . COPD (chronic obstructive pulmonary disease) (HCC)   . Chronic respiratory failure with hypoxia (HCC)   . Atrial fibrillation [facility])    Past Surgical History: History reviewed. No pertinent past surgical history. HPI:  Pt is a 82 y.o. male with a known history of diabetes, hypertension, CVA, vascular dementia, chronic respiratory failure with hypoxia, COPD and atrial fibrillation. He presents to the ED from a skilled nursing facility [facility]) due to shortness of breath. Patient is reported to have been having worsening shortness of breath in the last 1 week and has been very weak. At 5:30 PM this evening, he developed significant shortness of breath which resulted in massive facility calling EMS. Patient has a history of vascular dementia and is unable to provide any history. History is obtained from ED staff and patient's daughter. The patient is reported to have been having significant swelling for the last 3 weeks to 1 month and swelling was located in the abdomen and legs. He has had a decreased appetite and has been getting weaker. He developed a cough about one week ago and cough was productive of yellowish sputum and also developed some wheezes. At the nursing facility, a chest x-ray was obtained yesterday with unknown results. He was also said to have been diagnosed with acute bronchitis earlier today. Were unable to determine if patient had any orthopnea or PND. There is no history of recent travel or sick contacts. Influenza and pneumococcal vaccines are up-to-date.  There is no reported palpitations, chest pain, nausea or vomiting and there is no change in bowel habits but patient is reported to have had an increased urine output in recent times with occasional incontinence. When EMS picked up the patient at the skilled nursing facility, patient was significant for short of breath with an O2 sat of 86% and symmetrical was given as well as 3 doses of DuoNeb's. Pt is easily distracted w/ confusion noted in some responses. He requires cues and monitoring during oral intake sec. to impulsivity. Pt is oriented to self, agrees he is in the hospital.   Assessment / Plan / Recommendation Clinical Impression  Pt appeared to present w/ delayed, overt s/s of aspiration w/ trials of thin liquids - fairly consistent presentation of delayed, congested coughing following drinking of water despite aspiration precautions and pinching straw to monitor pt's impulsive drinking behavior. Overt s/s of aspiration (coughing included) was not noted w/ trials of Nectar liquids and purees; pt was fed slowly despite his impulsive requests for more to eat w/ each trial. Pt exhibited min. increased respiratory effort post several trials; this was corrected w/ brief rest breaks intermittently b/t trials. Oral phase wfl w/ trial consistencies assessed. Pt required feeding and monitoring w/ trials sec. to his declined Cognitive status. Rec. a Dys. 1 w/ Nectar liquids diet at this time; aspiration precautions; meds in puree. ST f/u for trials to upgrade as appropriate. NSG updated.    Aspiration Risk  Mild aspiration risk (-moderate aspiration risk)    Diet Recommendation  Dys. 1 w/ Nectar liquids; feeding assistance w/  monitoring for impulsivity(pinch straw, small bites); rest breaks during meal to avoid increased WOB; aspiration precautions  Medication Administration: Whole meds with puree    Other  Recommendations Recommended Consults:  (Dietician) Oral Care Recommendations: Oral care  BID;Staff/trained caregiver to provide oral care Other Recommendations: Order thickener from pharmacy;Prohibited food (jello, ice cream, thin soups);Remove water pitcher   Follow up Recommendations  Skilled Nursing facility    Frequency and Duration min 3x week  2 weeks       Prognosis Prognosis for Safe Diet Advancement: Fair (-good) Barriers to Reach Goals: Cognitive deficits      Swallow Study   General Date of Onset: 08/09/15 HPI: Pt is a 82 y.o. male with a known history of diabetes, hypertension, CVA, vascular dementia, chronic respiratory failure with hypoxia, COPD and atrial fibrillation. He presents to the ED from a skilled nursing facility [facility] due to shortness of breath. Patient is reported to have been having worsening shortness of breath in the last 1 week and has been very weak. At 5:30 PM this evening, he developed significant shortness of breath which resulted in massive facility calling EMS. Patient has a history of vascular dementia and is unable to provide any history. History is obtained from ED staff and patient's daughter. The patient is reported to have been having significant swelling for the last 3 weeks to 1 month and swelling was located in the abdomen and legs. He has had a decreased appetite and has been getting weaker. He developed a cough about one week ago and cough was productive of yellowish sputum and also developed some wheezes. At the nursing facility, a chest x-ray was obtained yesterday with unknown results. He was also said to have been diagnosed with acute bronchitis earlier today. Were unable to determine if patient had any orthopnea or PND. There is no history of recent travel or sick contacts. Influenza and pneumococcal vaccines are up-to-date. There is no reported palpitations, chest pain, nausea or vomiting and there is no change in bowel habits but patient is reported to have had an increased urine output in recent times with occasional  incontinence. When EMS picked up the patient at the skilled nursing facility, patient was significant for short of breath with an O2 sat of 86% and symmetrical was given as well as 3 doses of DuoNeb's. Pt is easily distracted w/ confusion noted in some responses. He requires cues and monitoring during oral intake sec. to impulsivity. Pt is oriented to self, agrees he is in the hospital. Type of Study: Bedside Swallow Evaluation Previous Swallow Assessment: none indicated; current CXR revealed linear opacities in the right perihilar and infrahilar lung which may represent mild atelectasis; Cardiomegaly and pulmonary edema, most consistent with CHF.  Diet Prior to this Study:  (unknown) Temperature Spikes Noted: No (wbc 7.9) Respiratory Status: Nasal cannula (2 liters) History of Recent Intubation: No Behavior/Cognition: Alert;Cooperative;Pleasant mood;Confused;Impulsive;Distractible;Requires cueing Oral Cavity Assessment: Within Functional Limits Oral Care Completed by SLP: Yes Oral Cavity - Dentition: Edentulous Vision:  (n/a) Self-Feeding Abilities: Needs assist;Total assist Patient Positioning: Upright in bed Baseline Vocal Quality: Normal Volitional Cough: Strong;Congested Volitional Swallow: Able to elicit    Oral/Motor/Sensory Function Overall Oral Motor/Sensory Function: Within functional limits   Ice Chips Ice chips: Within functional limits Presentation: Spoon (fed; 3 trials)   Thin Liquid Thin Liquid: Impaired Presentation: Cup;Straw (2-3 trials w/ each mode; multiple sips each trial) Oral Phase Impairments:  (none) Oral Phase Functional Implications:  (none) Pharyngeal  Phase Impairments:  Cough - Delayed (fairly consistent post trials ) Other Comments: noted impulsive drinking behavior; SLP pinched straw to monitor    Nectar Thick Nectar Thick Liquid: Within functional limits Presentation: Straw (fed; ~4 ozs) Other Comments: still impulsive drinking behavior; monitored    Honey Thick Honey Thick Liquid: Not tested   Puree Puree: Within functional limits Presentation: Spoon (fed; ~4 ozs) Other Comments: impulsive eating behavior - quickly asked for more as he swallowed the boluses   Solid   GO   Solid: Not tested Other Comments: edentulous; impulsive        Jerilynn Som, MS, CCC-SLP  Zyion Leidner 08/11/2015,10:45 AM

## 2015-08-11 NOTE — Consult Note (Signed)
John Thompson is a 82 y.o. male  [id number]  Primary Cardiologist: Adrian Blackwater Reason for Consultation: atrial fibrillation  HPI: John Thompson was brought to the hospital for acute respiratory failure.  He is being treated for pneumonia and early sepsis. Pt is noted to have a history of atrial fibrillation as noted on EKG in 2015. This morning his telemetry is reading atrial fibrillation in the 90's. He is wearing oxygen via nasal cannula.    Review of Systems: Patient denies chest pain, but other wise unable to provide useful information due to dementia  Past Medical History  Diagnosis Date  . Vascular dementia   . CVA (cerebral infarction)   . Hypertension   . Diabetes mellitus (HCC)   . COPD (chronic obstructive pulmonary disease) (HCC)   . Chronic respiratory failure with hypoxia (HCC)   . Atrial fibrillation (HCC)     Medications Prior to Admission  Medication Sig Dispense Refill  . acetaminophen (TYLENOL) 500 MG tablet Take 1,000 mg by mouth every 4 (four) hours as needed.    . ALPRAZolam (XANAX) 0.5 MG tablet Take 0.5 mg by mouth 2 (two) times daily as needed for anxiety.    Marland Kitchen amoxicillin-clavulanate (AUGMENTIN) 500-125 MG tablet Take 1 tablet by mouth every 12 (twelve) hours.    . carvedilol (COREG) 6.25 MG tablet Take 6.25 mg by mouth 2 (two) times daily with a meal.    . divalproex (DEPAKOTE ER) 250 MG 24 hr tablet Take 250 mg by mouth 2 (two) times daily.    . finasteride (PROSCAR) 5 MG tablet Take 5 mg by mouth daily.    . furosemide (LASIX) 20 MG tablet Take 20 mg by mouth daily.    Marland Kitchen guaiFENesin-dextromethorphan (ROBITUSSIN DM) 100-10 MG/5ML syrup Take 10 mLs by mouth every 4 (four) hours as needed for cough.    . hydrocortisone (ANUSOL-HC) 25 MG suppository Place 25 mg rectally at bedtime as needed for hemorrhoids or itching.    . insulin glargine (LANTUS) 100 UNIT/ML injection Inject 18 Units into the skin daily.    . insulin lispro (HUMALOG) 100 UNIT/ML injection  Inject 2-9 Units into the skin 3 (three) times daily before meals. Per sliding scale 0-99 0 units 100-149 2 units 150-199 3 units 200-249 4 units 250-299 5 units 300-349 6 units 350-399 7 units 400-449 8 units 450-499 9 units 500 or greater call MD    . ipratropium-albuterol (DUONEB) 0.5-2.5 (3) MG/3ML SOLN Take 3 mLs by nebulization 3 (three) times daily.    Marland Kitchen lisinopril (PRINIVIL,ZESTRIL) 2.5 MG tablet Take 2.5 mg by mouth daily.    . medroxyPROGESTERone (PROVERA) 5 MG tablet Take 5 mg by mouth daily.    . montelukast (SINGULAIR) 10 MG tablet Take 10 mg by mouth at bedtime.    Marland Kitchen PARoxetine (PAXIL) 10 MG tablet Take 10 mg by mouth daily.    . polyethylene glycol (MIRALAX / GLYCOLAX) packet Take 17 g by mouth every other day.    . polyvinyl alcohol (LIQUIFILM TEARS) 1.4 % ophthalmic solution Place 2 drops into both eyes as needed for dry eyes.    Marland Kitchen risperiDONE (RISPERDAL) 0.5 MG tablet Take 0.5 mg by mouth at bedtime.    . Rivaroxaban (XARELTO) 15 MG TABS tablet Take 15 mg by mouth daily with supper.    . simvastatin (ZOCOR) 20 MG tablet Take 20 mg by mouth daily.    . Skin Protectants, Misc. (EUCERIN) cream Apply 1 application topically as needed for dry skin.    Marland Kitchen  spironolactone (ALDACTONE) 25 MG tablet Take 12.5 mg by mouth daily.    . tamsulosin (FLOMAX) 0.4 MG CAPS capsule Take 0.4 mg by mouth daily.       . carvedilol  6.25 mg Oral BID WC  . cefTRIAXone (ROCEPHIN)  IV  1 g Intravenous Q24H  . divalproex  250 mg Oral BID  . doxycycline (VIBRAMYCIN) IV  100 mg Intravenous Q12H  . finasteride  5 mg Oral Daily  . furosemide  20 mg Intravenous Q12H  . insulin aspart  0-15 Units Subcutaneous TID AC & HS  . insulin glargine  25 Units Subcutaneous Daily  . lisinopril  2.5 mg Oral Daily  . medroxyPROGESTERone  5 mg Oral Daily  . montelukast  10 mg Oral QHS  . PARoxetine  10 mg Oral Daily  . polyethylene glycol  17 g Oral QODAY  . Rivaroxaban  15 mg Oral Q supper  . simvastatin   20 mg Oral Daily  . spironolactone  12.5 mg Oral Daily  . tamsulosin  0.4 mg Oral Daily  . vancomycin  1,500 mg Intravenous Q36H    Infusions:    No Known Allergies  Social History   Social History  . Marital Status: Married    Spouse Name: N/A  . Number of Children: N/A  . Years of Education: N/A   Occupational History  . Not on file.   Social History Main Topics  . Smoking status: Unknown If Ever Smoked  . Smokeless tobacco: Not on file  . Alcohol Use: Not on file  . Drug Use: Not on file  . Sexual Activity: Not on file   Other Topics Concern  . Not on file   Social History Narrative    Family History  Problem Relation Age of Onset  . Dementia      PHYSICAL EXAM: Filed Vitals:   08/11/15 0442 08/11/15 1013  BP: 92/74 112/73  Pulse: 67 94  Temp: 97.9 F (36.6 C)   Resp: 20      Intake/Output Summary (Last 24 hours) at 08/11/15 1147 Last data filed at 08/11/15 0749  Gross per 24 hour  Intake    540 ml  Output    775 ml  Net   -235 ml    General: Chronically ill appearing. No respiratory difficulty HEENT: normal Neck: supple. no JVD. Carotids 2+ bilat; no bruits. No lymphadenopathy or thryomegaly appreciated. Cor: PMI nondisplaced. Irregularly irregular rhythm. No rubs, gallops or murmurs. Lungs: scattered rhonchi Abdomen: soft, nontender, nondistended. No hepatosplenomegaly. No bruits or masses. Good bowel sounds. Extremities: 1+ pretibial edema Neuro: alert & oriented X 2- name and place. Affect pleasant.  ECG: atrial fibrillation 105 bpm  Results for orders placed or performed during the hospital encounter of 08/09/15 (from the past 24 hour(s))  Glucose, capillary     Status: Abnormal   Collection Time: 08/10/15  1:00 PM  Result Value Ref Range   Glucose-Capillary 415 (H) 65 - 99 mg/dL  Troponin I     Status: Abnormal   Collection Time: 08/10/15  1:02 PM  Result Value Ref Range   Troponin I 0.05 (H) <0.031 ng/mL  Glucose, capillary      Status: Abnormal   Collection Time: 08/10/15  2:56 PM  Result Value Ref Range   Glucose-Capillary 418 (H) 65 - 99 mg/dL   Comment 1 Notify RN   Glucose, capillary     Status: Abnormal   Collection Time: 08/10/15  4:41 PM  Result Value Ref  Range   Glucose-Capillary 384 (H) 65 - 99 mg/dL   Comment 1 Notify RN   MRSA PCR Screening     Status: Abnormal   Collection Time: 08/10/15  6:00 PM  Result Value Ref Range   MRSA by PCR POSITIVE (A) NEGATIVE  Influenza panel by PCR (type A & B, H1N1)     Status: None   Collection Time: 08/10/15  6:00 PM  Result Value Ref Range   Influenza A By PCR NEGATIVE NEGATIVE   Influenza B By PCR NEGATIVE NEGATIVE   H1N1 flu by pcr NOT DETECTED NOT DETECTED  Glucose, capillary     Status: Abnormal   Collection Time: 08/10/15  9:39 PM  Result Value Ref Range   Glucose-Capillary 429 (H) 65 - 99 mg/dL   Comment 1 Notify RN   CBC     Status: Abnormal   Collection Time: 08/11/15  5:26 AM  Result Value Ref Range   WBC 7.9 3.8 - 10.6 K/uL   RBC 4.78 4.40 - 5.90 MIL/uL   Hemoglobin 12.4 (L) 13.0 - 18.0 g/dL   HCT 29.537.3 (L) 62.140.0 - 30.852.0 %   MCV 78.2 (L) 80.0 - 100.0 fL   MCH 25.9 (L) 26.0 - 34.0 pg   MCHC 33.1 32.0 - 36.0 g/dL   RDW 65.716.2 (H) 84.611.5 - 96.214.5 %   Platelets 155 150 - 440 K/uL  Basic metabolic panel     Status: Abnormal   Collection Time: 08/11/15  5:26 AM  Result Value Ref Range   Sodium 140 135 - 145 mmol/L   Potassium 4.8 3.5 - 5.1 mmol/L   Chloride 111 101 - 111 mmol/L   CO2 21 (L) 22 - 32 mmol/L   Glucose, Bld 297 (H) 65 - 99 mg/dL   BUN 76 (H) 6 - 20 mg/dL   Creatinine, Ser 9.522.40 (H) 0.61 - 1.24 mg/dL   Calcium 8.2 (L) 8.9 - 10.3 mg/dL   GFR calc non Af Amer 23 (L) >60 mL/min   GFR calc Af Amer 27 (L) >60 mL/min   Anion gap 8 5 - 15  Glucose, capillary     Status: Abnormal   Collection Time: 08/11/15  7:26 AM  Result Value Ref Range   Glucose-Capillary 276 (H) 65 - 99 mg/dL   Comment 1 Notify RN   Glucose, capillary     Status:  Abnormal   Collection Time: 08/11/15 11:23 AM  Result Value Ref Range   Glucose-Capillary 272 (H) 65 - 99 mg/dL   Comment 1 Notify RN    Dg Chest Port 1 View  08/09/2015  CLINICAL DATA:  Wheezing and respiratory distress. EXAM: PORTABLE CHEST 1 VIEW COMPARISON:  08/18/2014 FINDINGS: Lung volumes are low. Cardiomegaly, with mild increase in the interim. There is mild central pulmonary edema. More linear opacities are seen in the right perihilar and infrahilar lung. No large pleural effusion. No pneumothorax. IMPRESSION: 1. Cardiomegaly and pulmonary edema, most consistent with CHF. 2. More linear opacities in the right perihilar and infrahilar lung, may reflect atelectasis, asymmetric pulmonary vasculature, or superimposed pneumonia. Electronically Signed   By: Rubye OaksMelanie  Ehinger M.D.   On: 08/09/2015 21:16     ASSESSMENT AND PLAN: COPD and pneumonia causing elevated heart rate. Chronic atrial fibrillation. May increase carvedilol once blood pressure is stable. Advise echocardiogram to assess LV function.  Berton BonJanine Brittnee Gaetano, NP 08/11/2015 11:47 AM

## 2015-08-11 NOTE — NC FL2 (Deleted)
[place] MEDICAID FL2 LEVEL OF CARE SCREENING TOOL     IDENTIFICATION  Patient Name: John Thompson Birthdate: [birth year] Sex: male Admission Date (Current Location): 08/09/2015  [facility] [place]  [place]  ([id number]) Facility and Address:  [facility], [address], [place], [place] [address]      Provider Number: [id number]  Attending Physician Name and Address:  Altamese DillingVaibhavkumar Vachhani, MD  Relative Name and Phone Number:       Current Level of Care: Hospital Recommended Level of Care: Skilled Nursing Facility Prior Approval Number:    Date Approved/Denied:   PASRR Number:  [id number])  Discharge Plan: SNF    Current Diagnoses: Patient Active Problem List   Diagnosis Date Noted  . Atrial fibrillation with rapid ventricular response (HCC) 08/10/2015  . Acute on chronic respiratory failure with hypoxia (HCC) 08/09/2015  . CHF exacerbation (HCC) 08/09/2015  . Healthcare-associated pneumonia 08/09/2015  . Sepsis (HCC) 08/09/2015  . Elevated troponin 08/09/2015  . Prolonged Q-T interval on ECG 08/09/2015  . Atrial fibrillation with RVR (HCC) 08/09/2015  . Acute on chronic renal insufficiency (HCC) 08/09/2015  . Type 2 diabetes mellitus (HCC) 08/09/2015    Orientation RESPIRATION BLADDER Height & Weight     Self  O2 (Nasal Cannula 2 (L/min) ) Continent Weight: 170 lb 4.8 oz (77.248 kg) Height:  5\' 8"  (172.7 cm)  BEHAVIORAL SYMPTOMS/MOOD NEUROLOGICAL BOWEL NUTRITION STATUS   (None)  (None) Continent Diet (DYS 1)  AMBULATORY STATUS COMMUNICATION OF NEEDS Skin   Limited Assist Verbally Normal                       Personal Care Assistance Level of Assistance  Bathing, Feeding, Dressing Bathing Assistance: Limited assistance Feeding assistance: Limited assistance Dressing Assistance: Limited assistance     Functional Limitations Info  Sight, Hearing, Speech Sight Info: Adequate Hearing Info: Adequate Speech Info:  Adequate    SPECIAL CARE FACTORS FREQUENCY                       Contractures      Additional Factors Info  Code Status, Allergies, Insulin Sliding Scale, Isolation Precautions Code Status Info:  (DNR) Allergies Info:  (No Known Allergies)   Insulin Sliding Scale Info:  (insulin aspart (novoLOG) injection 0-15 Units 0-15 Units, Subcutaneous, 3 times daily before meals & bedtime) Isolation Precautions Info:  (Contact Precautions- MRSA)     Current Medications (08/11/2015):  This is the current hospital active medication list Current Facility-Administered Medications  Medication Dose Route Frequency Provider Last Rate Last Dose  . acetaminophen (TYLENOL) tablet 650 mg  650 mg Oral Q4H PRN Robley FriesAbdullahi Oseni, MD      . ALPRAZolam Prudy Feeler(XANAX) tablet 0.5 mg  0.5 mg Oral BID PRN Robley FriesAbdullahi Oseni, MD      . carvedilol (COREG) tablet 6.25 mg  6.25 mg Oral BID WC Robley FriesAbdullahi Oseni, MD   6.25 mg at 08/11/15 1015  . cefTRIAXone (ROCEPHIN) 1 g in dextrose 5 % 50 mL IVPB  1 g Intravenous Q24H Robley FriesAbdullahi Oseni, MD   1 g at 08/10/15 2232  . divalproex (DEPAKOTE ER) 24 hr tablet 250 mg  250 mg Oral BID Robley FriesAbdullahi Oseni, MD   250 mg at 08/11/15 1014  . doxycycline (VIBRAMYCIN) 100 mg in dextrose 5 % 250 mL IVPB  100 mg Intravenous Q12H Robley FriesAbdullahi Oseni, MD   100 mg at 08/11/15 0114  . finasteride (PROSCAR) tablet 5 mg  5  mg Oral Daily Robley Fries, MD   5 mg at 08/11/15 1013  . furosemide (LASIX) injection 20 mg  20 mg Intravenous Q12H Altamese Dilling, MD   20 mg at 08/11/15 1014  . guaiFENesin-dextromethorphan (ROBITUSSIN DM) 100-10 MG/5ML syrup 10 mL  10 mL Oral Q4H PRN Robley Fries, MD      . hydrocerin (EUCERIN) cream 1 application  1 application Topical PRN Robley Fries, MD      . hydrocortisone (ANUSOL-HC) suppository 25 mg  25 mg Rectal QHS PRN Robley Fries, MD      . insulin aspart (novoLOG) injection 0-15 Units  0-15 Units Subcutaneous TID AC & HS Oralia Manis, MD   8 Units at  08/11/15 1200  . insulin glargine (LANTUS) injection 25 Units  25 Units Subcutaneous Daily Altamese Dilling, MD   25 Units at 08/11/15 1200  . ipratropium-albuterol (DUONEB) 0.5-2.5 (3) MG/3ML nebulizer solution 3 mL  3 mL Nebulization Q4H PRN Altamese Dilling, MD      . lisinopril (PRINIVIL,ZESTRIL) tablet 2.5 mg  2.5 mg Oral Daily Robley Fries, MD   2.5 mg at 08/11/15 1024  . medroxyPROGESTERone (PROVERA) tablet 5 mg  5 mg Oral Daily Robley Fries, MD   5 mg at 08/11/15 1024  . montelukast (SINGULAIR) tablet 10 mg  10 mg Oral QHS Robley Fries, MD   Stopped at 08/10/15 2200  . PARoxetine (PAXIL) tablet 10 mg  10 mg Oral Daily Robley Fries, MD   10 mg at 08/11/15 1013  . polyethylene glycol (MIRALAX / GLYCOLAX) packet 17 g  17 g Oral QODAY Robley Fries, MD   17 g at 08/10/15 1102  . polyvinyl alcohol (LIQUIFILM TEARS) 1.4 % ophthalmic solution 2 drop  2 drop Both Eyes PRN Robley Fries, MD      . Rivaroxaban (XARELTO) tablet 15 mg  15 mg Oral Q supper Robley Fries, MD   15 mg at 08/10/15 1657  . simvastatin (ZOCOR) tablet 20 mg  20 mg Oral Daily Robley Fries, MD   20 mg at 08/11/15 1014  . spironolactone (ALDACTONE) tablet 12.5 mg  12.5 mg Oral Daily Robley Fries, MD   12.5 mg at 08/11/15 1014  . tamsulosin (FLOMAX) capsule 0.4 mg  0.4 mg Oral Daily Robley Fries, MD   0.4 mg at 08/11/15 1013     Discharge Medications: Please see discharge summary for a list of discharge medications.  Relevant Imaging Results:  Relevant Lab Results:   Additional Information  (SSN [id number])  Verta Ellen Anael Rosch, LCSW

## 2015-08-11 NOTE — Progress Notes (Signed)
Dr. Elisabeth PigeonVachhani notified of heart rate jumping from 90-130's, worse with coughing and activity. Per MD, see if speech can see patient so we can resume PO meds this morning.

## 2015-08-12 ENCOUNTER — Inpatient Hospital Stay: Payer: Medicare Other

## 2015-08-12 LAB — GLUCOSE, CAPILLARY
GLUCOSE-CAPILLARY: 169 mg/dL — AB (ref 65–99)
GLUCOSE-CAPILLARY: 171 mg/dL — AB (ref 65–99)
Glucose-Capillary: 241 mg/dL — ABNORMAL HIGH (ref 65–99)
Glucose-Capillary: 211 mg/dL — ABNORMAL HIGH (ref 65–99)

## 2015-08-12 LAB — BLOOD GAS, ARTERIAL
ACID-BASE EXCESS: 1.5 mmol/L (ref 0.0–3.0)
Allens test (pass/fail): POSITIVE — AB
BICARBONATE: 25.9 meq/L (ref 21.0–28.0)
FIO2: 0.24
O2 SAT: 90.9 %
Patient temperature: 37
pCO2 arterial: 39 mmHg (ref 32.0–48.0)
pH, Arterial: 7.43 (ref 7.350–7.450)
pO2, Arterial: 59 mmHg — ABNORMAL LOW (ref 83.0–108.0)

## 2015-08-12 LAB — BASIC METABOLIC PANEL
ANION GAP: 7 (ref 5–15)
BUN: 69 mg/dL — ABNORMAL HIGH (ref 6–20)
CHLORIDE: 111 mmol/L (ref 101–111)
CO2: 22 mmol/L (ref 22–32)
Calcium: 8.1 mg/dL — ABNORMAL LOW (ref 8.9–10.3)
Creatinine, Ser: 2.18 mg/dL — ABNORMAL HIGH (ref 0.61–1.24)
GFR calc non Af Amer: 26 mL/min — ABNORMAL LOW (ref >60)
GFR, EST AFRICAN AMERICAN: 30 mL/min — AB (ref >60)
Glucose, Bld: 195 mg/dL — ABNORMAL HIGH (ref 65–99)
POTASSIUM: 4.5 mmol/L (ref 3.5–5.1)
Sodium: 140 mmol/L (ref 135–145)

## 2015-08-12 LAB — URINE CULTURE

## 2015-08-12 MED ORDER — INSULIN GLARGINE 100 UNIT/ML ~~LOC~~ SOLN
25.0000 [IU] | Freq: Every day | SUBCUTANEOUS | Status: DC
  Administered 2015-08-12 – 2015-08-13 (×2): 25 [IU] via SUBCUTANEOUS
  Filled 2015-08-12 (×3): qty 0.25

## 2015-08-12 MED ORDER — INSULIN GLARGINE 100 UNIT/ML ~~LOC~~ SOLN
30.0000 [IU] | Freq: Every day | SUBCUTANEOUS | Status: DC
  Filled 2015-08-12: qty 0.3

## 2015-08-12 MED ORDER — CHLORHEXIDINE GLUCONATE CLOTH 2 % EX PADS
6.0000 | MEDICATED_PAD | Freq: Every day | CUTANEOUS | Status: DC
  Administered 2015-08-13: 6 via TOPICAL

## 2015-08-12 MED ORDER — DOXYCYCLINE HYCLATE 100 MG PO TABS
100.0000 mg | ORAL_TABLET | Freq: Two times a day (BID) | ORAL | Status: DC
  Administered 2015-08-12 – 2015-08-13 (×3): 100 mg via ORAL
  Filled 2015-08-12 (×3): qty 1

## 2015-08-12 MED ORDER — MUPIROCIN 2 % EX OINT
1.0000 "application " | TOPICAL_OINTMENT | Freq: Two times a day (BID) | CUTANEOUS | Status: DC
  Administered 2015-08-12 – 2015-08-13 (×2): 1 via NASAL
  Filled 2015-08-12: qty 22

## 2015-08-12 MED ORDER — CARVEDILOL 12.5 MG PO TABS
12.5000 mg | ORAL_TABLET | Freq: Two times a day (BID) | ORAL | Status: DC
  Administered 2015-08-12 – 2015-08-13 (×3): 12.5 mg via ORAL
  Filled 2015-08-12 (×3): qty 1

## 2015-08-12 MED ORDER — ACETAMINOPHEN 325 MG PO TABS: 650.0000 mg | ORAL_TABLET | ORAL | Status: DC | PRN

## 2015-08-12 NOTE — Progress Notes (Signed)
Notified Dr. Elisabeth PigeonVachhani that diabetes coordinator would like lantus to be changed back to 25 units. Order to change per Dr. Elisabeth PigeonVachhani.

## 2015-08-12 NOTE — Progress Notes (Signed)
cpap order noted for this patient. Placed mask on patient's face. Patient immediately snatched off completely disassembling mask in the process. Second attempt no success. Placed patient back on nasal cannula. Patient refusing to allow me to put mask on him

## 2015-08-12 NOTE — Progress Notes (Signed)
[facility] [place] [facility]   PATIENT NAME: John NortonJames Thompson    MR#:  [id number]  DATE OF BIRTH:  1928-06-20  SUBJECTIVE:  CHIEF COMPLAINT:   Chief Complaint  Patient presents with  . Respiratory Distress   Brought from NH for worsening mental status, and SOB.   Required Bipap initialy, much improved, awake today, but appears confused. comfortable on nasal canula.   REVIEW OF SYSTEMS:   Pt is confused. Not able to get ROS.  ROS  DRUG ALLERGIES:  No Known Allergies  VITALS:  Blood pressure 104/76, pulse 84, temperature 98.5 F (36.9 C), temperature source Oral, resp. rate 18, height 5\' 8"  (1.727 m), weight 50.803 kg (112 lb), SpO2 99 %.  PHYSICAL EXAMINATION:  GENERAL:  82 y.o.-year-old patient lying in the bed with no acute distress.  EYES: Pupils equal, round, reactive to light and accommodation. No scleral icterus. Extraocular muscles intact.  HEENT: Head atraumatic, normocephalic. Oropharynx and nasopharynx clear.  NECK:  Supple, no jugular venous distention. No thyroid enlargement, no tenderness.  LUNGS: Normal breath sounds bilaterally, mild wheezing, some crepitation. No use of accessory muscles of respiration. On nasal canula CARDIOVASCULAR: S1, S2 normal. No murmurs, rubs, or gallops.  ABDOMEN: Soft, nontender, nondistended. Bowel sounds present. No organomegaly or mass.  EXTREMITIES: No pedal edema, cyanosis, or clubbing.  NEUROLOGIC: Cranial nerves II through XII are intact. Muscle strength 3-4/5 in all extremities. Sensation intact. Gait not checked.  PSYCHIATRIC: The patient is alert and confused.  SKIN: No obvious rash, lesion, or ulcer.   Physical Exam LABORATORY PANEL:   CBC  Recent Labs Lab 08/11/15 0526  WBC 7.9  HGB 12.4*  HCT 37.3*  PLT 155   ------------------------------------------------------------------------------------------------------------------  Chemistries   Recent Labs Lab 08/09/15 2037 08/10/15 0445  08/11/15 0526  NA 141 139 140  K 4.7 4.9 4.8  CL 112* 110 111  CO2 24 20* 21*  GLUCOSE 137* 325* 297*  BUN 46* 50* 76*  CREATININE 2.41* 2.38* 2.40*  CALCIUM 8.8* 8.6* 8.2*  MG  --  2.7*  --   AST 23  --   --   ALT 17  --   --   ALKPHOS 55  --   --   BILITOT 0.7  --   --    ------------------------------------------------------------------------------------------------------------------  Cardiac Enzymes  Recent Labs Lab 08/10/15 0824 08/10/15 1302  TROPONINI 0.03 0.05*   ------------------------------------------------------------------------------------------------------------------  RADIOLOGY:  Dg Chest 2 View  08/12/2015  CLINICAL DATA:  Hypoxia. EXAM: CHEST  2 VIEW COMPARISON:  08/09/2015. FINDINGS: Cardiomegaly with diffuse bilateral from interstitial prominence consistent congestive heart failure. Small left pleural effusion cannot be excluded. Findings have progressed slightly from prior exam. No pneumothorax. IMPRESSION: Cardiomegaly with diffuse bilateral from interstitial prominence consistent with congestive heart failure. Small left pleural effusion cannot be excluded. Findings have progressed slightly from prior exam. Electronically Signed   By: Maisie Fushomas  Register   On: 08/12/2015 08:10    ASSESSMENT AND PLAN:   Principal Problem:   Acute on chronic respiratory failure with hypoxia (HCC) Active Problems:   CHF exacerbation (HCC)   Healthcare-associated pneumonia   Sepsis (HCC)   Elevated troponin   Prolonged Q-T interval on ECG   Atrial fibrillation with RVR (HCC)   Acute on chronic renal insufficiency (HCC)   Type 2 diabetes mellitus (HCC)   Atrial fibrillation with rapid ventricular response (HCC)  1). Acute respiratory failure with hypoxia secondary to CHF exacerbation with elevated troponin -  He  was diagnosed with acute bronchitis in NH. Chest x-ray is consistent with pulmonary edema. - Patient received 40 mg of Lasix in the ED.  - very good response  and no leg edema now as per daughter- much better. - will cont small dose lasix now due to worsening renal func. - stable on follow up troponin. Initial troponin was elevated at 0.06. - Continue supplementary oxygen, DuoNeb's - there is no information on and 2-D echo in the chart. Obtain 2-D echo - continue spironolactone, lisinopril, Coreg and statin. - Measure daily weights, input and output and ensure fluid restriction  - Checked lipid panel and A1c  - appreciated cardiology help.  2). Healthcare associated pneumonia with early sepsis - patient presents with clinical syndrome consistent with pneumonia and chest x-ray suggest pneumonia. - on vancomycin, doxycycline and ceftriaxone. - Follow results of blood and sputum cultures , MRSA screen positive. - Continue supplementary oxygen, DuoNeb and guaifenesin - incentive spirometry  - d/c vanc due to renal failure. - swallow eval.  3). A. fib with RVR - patient has a baseline A. fib but has been going into tachycardia with up to 140 bpm.  - Slowed down spontaneously over few hours without requirement of Cardizem drip. - normal TSH - Continue Rivaroxaban - continue coreg.  4). Acute on chronic renal insufficiency - patient has a creatinine of 2.41 from a baseline of 1.37. This is most likely due to congestion. Continue diuresis and patient and monitor creatinine levels. - stopped Vanc, low dose lasix, monitor.  5). Prolonged QT - QT is prolonged at 514. Patient is on risperidone and SSRI and was given azithromycin in the ED. - Discontinue risperidone and azithromycin and start patient on doxycycline in place of azithromycin. - normal magnesium level and monitor QT closely.  6). Type 2 diabetes mellitus - patient has a history of type 2 diabetes mellitus as is on Lantus and sliding scale insulin. We'll continue these. - High A1c and place patient on consistent carbohydrate diet. - Monitor Accu-Cheks and adjust insulin doses  accordingly - due to persistent Hyperglycemia increased lantus.   All the records are reviewed and case discussed with Care Management/Social Workerr. Management plans discussed with the patient, family and they are in agreement.  CODE STATUS: DNR  TOTAL TIME TAKING CARE OF THIS PATIENT: 35 minutes.    POSSIBLE D/C IN 2-3 DAYS, DEPENDING ON CLINICAL CONDITION.   Altamese Dilling M.D on 08/12/2015   Between 7am to 6pm - Pager - [phone]  After 6pm go to [email or web address] - password [email or web address]  [facility]  Office  [phone]  CC: Primary care physician; Keane Police, MD  Note: This dictation was prepared with Dragon dictation along with smaller phrase technology. Any transcriptional errors that result from this process are unintentional.

## 2015-08-12 NOTE — Progress Notes (Addendum)
Inpatient Diabetes Program Recommendations  AACE/ADA: New Consensus Statement on Inpatient Glycemic Control (2015)  Target Ranges:  Prepandial:   less than 140 mg/dL      Peak postprandial:   less than 180 mg/dL (1-2 hours)      Critically ill patients:  140 - 180 mg/dL   Review of Glycemic Control  Results for John Thompson, John Thompson (MRN [id number]) as of 08/12/2015 09:06  Ref. Range 08/11/2015 07:26 08/11/2015 11:23 08/11/2015 16:38 08/11/2015 21:35 08/12/2015 07:47  Glucose-Capillary Latest Ref Range: 65-99 mg/dL 782276 (H) 956272 (H) 213285 (H) 240 (H) 169 (H)    Diabetes history: Type 2 with decreased renal function Outpatient Diabetes medications: Lantus 18 units qhs, Novolog 2-9 units tid Current orders for Inpatient glycemic control: Lantus 30 units qday, Novolog 0-15 units tid and hs  Inpatient Diabetes Program Recommendations:   Patient has poor renal function- consider decreasing Lantus to 25 units   (30 units is 0.6units/kg). Patient only weighs 112lbs.   Consider changing hs correction to Novolog 0-5 units qhs.   Susette RacerJulie Hosie Sharman, RN, BA, MHA, CDE Diabetes Coordinator Inpatient Diabetes Program  [phone] (Team Pager) [phone] [facility] Office) 08/12/2015 9:15 AM

## 2015-08-12 NOTE — Progress Notes (Signed)
Patient seeming to have periods of apnea while sleeping during this shift. When telemetry monitor alerts, I check on patient and notice that he is asleep and breathing; however he does seem to have times when his breathing slows dramatically and is either very shallow or no chest movement followed by very rapid breathing. He is dozing off during conversation more now than observed earlier. While Natalia LeatherwoodKatherine, SLP was helping the patient with lunch, she noticed he would doze off while eating and had a period of at least 20 seconds with no or very limited chest movement, followed by rapid breathing. Patient is confused at baseline. Dr. Elisabeth PigeonVachhani was notified. MD to round shortly. Orders to draw ABG -  Amy, RT was notified to come draw ABG and assess pt. Will continue to monitor.

## 2015-08-12 NOTE — Progress Notes (Signed)
Speech Language Pathology Treatment: Dysphagia  Patient Details Name: John Thompson MRN: [id number] DOB: 07-06-1928 Today's Date: 08/12/2015 Time: 1300-1400 SLP Time Calculation (min) (ACUTE ONLY): 60 min  Assessment / Plan / Recommendation Clinical Impression  Pt appeared to present w/ delayed, overt s/s of aspiration(coughing) post trials of Nectar liquids x2 but noted this was in conjunction w/ pt dozing off during apnea moments. Unsure if this presentation of delayed, coughing following drinking was related to any oral residue as he dozed off because the coughing did not immediately follow the po trials and vocal quality was clear immediately following. Pt required monitoring of bolus sizes during drinking as he tended to drink impulsively taking multiple swallows. No overt s/s of aspiration were noted w/ trials of the remaining Nectar liquids and purees; pt was fed slowly despite his impulsive requests for more to eat w/ each trial. When pt exhibited the apnea episodes (many lasting ~20+ secs.), rapid recovery respiratory effort was noted; pt even exhibited apnea moments while being fed. To lessen risk for aspiration, pt was given brief rest/pause beore a trial. Oral phase grossly wfl w/ trial consistencies assessed. Pt required feeding and monitoring w/ trials sec. to his declined Cognitive status. Rec. Continue a Dys. 1 w/ Nectar liquids diet at this time; aspiration precautions; meds in puree. Pt appears at too high a risk for aspiration sec. To the apnea and declined Cognitive status. ST f/u for trials to upgrade as appropriate as medical status improves; rec. F/u post discharge. MD/NSG updated on apnea concerns. Rec. Dietician f/u for supplements d/t decreased oral intake and dysphagia.   HPI HPI: Pt is a 82 y.o. male with a known history of diabetes, hypertension, CVA, vascular dementia, chronic respiratory failure with hypoxia, COPD and atrial fibrillation. He presents to the ED from a skilled  nursing facility [facility]) due to shortness of breath. Patient is reported to have been having worsening shortness of breath in the last 1 week and has been very weak. At 5:30 PM this evening, he developed significant shortness of breath which resulted in massive facility calling EMS. Patient has a history of vascular dementia and is unable to provide any history. History is obtained from ED staff and patient's daughter. The patient is reported to have been having significant swelling for the last 3 weeks to 1 month and swelling was located in the abdomen and legs. He has had a decreased appetite and has been getting weaker. He developed a cough about one week ago and cough was productive of yellowish sputum and also developed some wheezes. At the nursing facility, a chest x-ray was obtained yesterday with unknown results. He was also said to have been diagnosed with acute bronchitis earlier today. Were unable to determine if patient had any orthopnea or PND. There is no history of recent travel or sick contacts. Influenza and pneumococcal vaccines are up-to-date. There is no reported palpitations, chest pain, nausea or vomiting and there is no change in bowel habits but patient is reported to have had an increased urine output in recent times with occasional incontinence. When EMS picked up the patient at the skilled nursing facility, patient was significant for short of breath with an O2 sat of 86% and symmetrical was given as well as 3 doses of DuoNeb's. Pt is easily distracted w/ confusion noted in some responses. He requires cues and monitoring during oral intake sec. to impulsivity. Pt is oriented to self, agrees he is in the hospital.  SLP Plan  Continue with current plan of care     Recommendations  Diet recommendations: Dysphagia 1 (puree);Nectar-thick liquid Liquids provided via: Cup;Straw Medication Administration: Whole meds with puree Supervision: Full supervision/cueing for  compensatory strategies;Trained caregiver to feed patient Compensations: Minimize environmental distractions;Slow rate;Small sips/bites;Lingual sweep for clearance of pocketing;Multiple dry swallows after each bite/sip;Follow solids with liquid Postural Changes and/or Swallow Maneuvers: Seated upright 90 degrees;Upright 30-60 min after meal             General recommendations:  (Dietician) Oral Care Recommendations: Oral care BID;Staff/trained caregiver to provide oral care Follow up Recommendations: Skilled Nursing facility (TBD) Plan: Continue with current plan of care     GO               Jerilynn SomKatherine Watson, MS, CCC-SLP  Watson,Katherine 08/12/2015, 2:13 PM

## 2015-08-12 NOTE — Progress Notes (Signed)
SUBJECTIVE: Patient denies any chest pain or shortness of breath   Filed Vitals:   08/11/15 2006 08/12/15 0500 08/12/15 0504 08/12/15 0835  BP: 106/75  104/76 115/58  Pulse: 42  84 116  Temp: 98.1 F (36.7 C)  98.5 F (36.9 C)   TempSrc: Oral  Oral   Resp: 20  18   Height:      Weight:  112 lb (50.803 kg)    SpO2: 96%  99%     Intake/Output Summary (Last 24 hours) at 08/12/15 0912 Last data filed at 08/11/15 1534  Gross per 24 hour  Intake    250 ml  Output      0 ml  Net    250 ml    LABS: Basic Metabolic Panel:  Recent Labs  16/02/9603/04/17 0445 08/11/15 0526 08/12/15 0841  NA 139 140 140  K 4.9 4.8 4.5  CL 110 111 111  CO2 20* 21* 22  GLUCOSE 325* 297* 195*  BUN 50* 76* 69*  CREATININE 2.38* 2.40* 2.18*  CALCIUM 8.6* 8.2* 8.1*  MG 2.7*  --   --    Liver Function Tests:  Recent Labs  08/09/15 2037  AST 23  ALT 17  ALKPHOS 55  BILITOT 0.7  PROT 7.6  ALBUMIN 3.2*   No results for input(s): LIPASE, AMYLASE in the last 72 hours. CBC:  Recent Labs  08/09/15 2037 08/11/15 0526  WBC 7.8 7.9  NEUTROABS 4.1  --   HGB 12.2* 12.4*  HCT 37.7* 37.3*  MCV 79.6* 78.2*  PLT 149* 155   Cardiac Enzymes:  Recent Labs  08/10/15 0445 08/10/15 0824 08/10/15 1302  TROPONINI 0.05* 0.03 0.05*   BNP: Invalid input(s): POCBNP D-Dimer: No results for input(s): DDIMER in the last 72 hours. Hemoglobin A1C:  Recent Labs  08/10/15 0445  HGBA1C 9.2*   Fasting Lipid Panel:  Recent Labs  08/10/15 0445  CHOL 117  HDL 34*  LDLCALC 63  TRIG [id number]  CHOLHDL 3.4   Thyroid Function Tests:  Recent Labs  08/10/15 0445  TSH 1.853   Anemia Panel: No results for input(s): VITAMINB12, FOLATE, FERRITIN, TIBC, IRON, RETICCTPCT in the last 72 hours.   PHYSICAL EXAM General: Well developed, well nourished, in no acute distress HEENT:  Normocephalic and atramatic Neck:  No JVD.  Lungs: Clear bilaterally to auscultation and percussion. Heart: HRRR . Normal S1  and S2 without gallops or murmurs.  Abdomen: Bowel sounds are positive, abdomen soft and non-tender  Msk:  Back normal, normal gait. Normal strength and tone for age. Extremities: No clubbing, cyanosis or edema.   Neuro: Alert and oriented X 3. Psych:  Good affect, responds appropriately  TELEMETRY:A. fib with controlled ventricular response rate  ASSESSMENT AND PLAN: Patient has cardiomyopathy with left ventricular ejection fraction 25% with underlying rhythm atrial fibrillation. Advise increasing carvedilol to 12.5 twice a day.  Principal Problem:   Acute on chronic respiratory failure with hypoxia (HCC) Active Problems:   CHF exacerbation (HCC)   Healthcare-associated pneumonia   Sepsis (HCC)   Elevated troponin   Prolonged Q-T interval on ECG   Atrial fibrillation with RVR (HCC)   Acute on chronic renal insufficiency (HCC)   Type 2 diabetes mellitus (HCC)   Atrial fibrillation with rapid ventricular response (HCC)    Adair Lemar A, MD, [facility] MissionFACC 08/12/2015 9:12 AM

## 2015-08-13 ENCOUNTER — Inpatient Hospital Stay
Admit: 2015-08-13 | Discharge: 2015-08-13 | Disposition: A | Discharge status: Home / Self Care | Payer: Medicare Other | Attending: Cardiology | Admitting: Cardiology

## 2015-08-13 ENCOUNTER — Inpatient Hospital Stay: Payer: Medicare Other

## 2015-08-13 LAB — BASIC METABOLIC PANEL
Anion gap: 9 (ref 5–15)
BUN: 73 mg/dL — AB (ref 6–20)
CALCIUM: 8.4 mg/dL — AB (ref 8.9–10.3)
CO2: 20 mmol/L — AB (ref 22–32)
Chloride: 114 mmol/L — ABNORMAL HIGH (ref 101–111)
Creatinine, Ser: 2.13 mg/dL — ABNORMAL HIGH (ref 0.61–1.24)
GFR calc Af Amer: 31 mL/min — ABNORMAL LOW (ref >60)
GFR, EST NON AFRICAN AMERICAN: 27 mL/min — AB (ref >60)
GLUCOSE: 190 mg/dL — AB (ref 65–99)
POTASSIUM: 4.7 mmol/L (ref 3.5–5.1)
Sodium: 143 mmol/L (ref 135–145)

## 2015-08-13 LAB — GLUCOSE, CAPILLARY
GLUCOSE-CAPILLARY: 150 mg/dL — AB (ref 65–99)
Glucose-Capillary: 176 mg/dL — ABNORMAL HIGH (ref 65–99)
Glucose-Capillary: 293 mg/dL — ABNORMAL HIGH (ref 65–99)

## 2015-08-13 MED ORDER — ENSURE ENLIVE PO LIQD: 237.0000 mL | Freq: Two times a day (BID) | ORAL | Status: AC

## 2015-08-13 MED ORDER — FUROSEMIDE 20 MG PO TABS: 20.0000 mg | ORAL_TABLET | Freq: Every day | ORAL | Status: DC

## 2015-08-13 MED ORDER — DOXYCYCLINE HYCLATE 100 MG PO TABS: 100.0000 mg | ORAL_TABLET | Freq: Two times a day (BID) | ORAL | Status: DC

## 2015-08-13 MED ORDER — IPRATROPIUM-ALBUTEROL 0.5-2.5 (3) MG/3ML IN SOLN: 3.0000 mL | Freq: Four times a day (QID) | RESPIRATORY_TRACT | Status: AC | PRN

## 2015-08-13 MED ORDER — INSULIN GLARGINE 100 UNIT/ML ~~LOC~~ SOLN: 25.0000 [IU] | Freq: Every day | SUBCUTANEOUS | Status: DC

## 2015-08-13 MED ORDER — ENSURE ENLIVE PO LIQD
237.0000 mL | Freq: Two times a day (BID) | ORAL | Status: DC
  Administered 2015-08-13: 237 mL via ORAL

## 2015-08-13 NOTE — Discharge Summary (Signed)
[facility] Physicians - [place] at [facility]   PATIENT NAME: John Thompson    MR#:  [id number]  DATE OF BIRTH:  [birth year]  DATE OF ADMISSION:  08/09/2015 ADMITTING PHYSICIAN: Ihor Austin, MD  DATE OF DISCHARGE: 08/13/2015  PRIMARY CARE PHYSICIAN: Keane Police, MD    ADMISSION DIAGNOSIS:  Respiratory distress [R06.00] Community acquired pneumonia [J18.9]  DISCHARGE DIAGNOSIS:  Principal Problem:   Acute on chronic respiratory failure with hypoxia (HCC) Active Problems:   CHF exacerbation (HCC)   Healthcare-associated pneumonia   Sepsis (HCC)   Elevated troponin   Prolonged Q-T interval on ECG   Atrial fibrillation with RVR (HCC)   Acute on chronic renal insufficiency (HCC)   Type 2 diabetes mellitus (HCC)   Atrial fibrillation with rapid ventricular response (HCC)   SECONDARY DIAGNOSIS:   Past Medical History  Diagnosis Date  . Vascular dementia   . CVA (cerebral infarction)   . Hypertension   . Diabetes mellitus (HCC)   . COPD (chronic obstructive pulmonary disease) (HCC)   . Chronic respiratory failure with hypoxia (HCC)   . Atrial fibrillation [facility])     HOSPITAL COURSE:   1). Acute respiratory failure with hypoxia secondary to CHF exacerbation with elevated troponin - He was diagnosed with acute bronchitis in NH. Chest x-ray is consistent with pulmonary edema. - Patient received 40 mg of Lasix in the ED.  - very good response and no leg edema now as per daughter- much better. - cont small dose lasix now due to worsening renal func. - stable on follow up troponin. Initial troponin was elevated at 0.06. - Continue supplementary oxygen, DuoNeb's - there is no information on and 2-D echo in the chart. Obtained 2-D echo - continue spironolactone, lisinopril, Coreg and statin. - Measure daily weights, input and output and ensure fluid restriction  - Checked lipid panel and A1c  - appreciated cardiology help. - i/o not properly  charted, but pt clinically much better. - for sleep apnea- will try cpap - if pt tolerates, though pt is demented and I doubt he will comply.  2). Healthcare associated pneumonia with early sepsis - patient presents with clinical syndrome consistent with pneumonia and chest x-ray suggest pneumonia. - on vancomycin, doxycycline and ceftriaxone. - Follow results of blood and sputum cultures , MRSA screen positive. - Continue supplementary oxygen, DuoNeb and guaifenesin - incentive spirometry  - d/c vanc due to renal failure. - swallow eval done on dysphagia diet, switch to oral Abx.  will give total 7-8 days of Abs.  3). Chronic A. fib with RVR - patient has a baseline A. fib but has been going into tachycardia with up to 140 bpm.  - Slowed down spontaneously over few hours without requirement of Cardizem drip. - normal TSH - Continue Rivaroxaban - continue coreg. Increased dose as per cardiology.  4). Acute on chronic renal insufficiency- CKD stage 4. - patient has a creatinine of 2.41 from a baseline of 1.37. This is most likely due to congestion. Continue diuresis and patient and monitor creatinine levels. - stopped Vanc, low dose lasix, monitor.- renal func stable.  5). Prolonged QT - QT is prolonged at 514. Patient is on risperidone and SSRI and was given azithromycin in the ED. - Discontinue risperidone and azithromycin and start patient on doxycycline in place of azithromycin. - normal magnesium level and monitor QT closely.  6). Type 2 diabetes mellitus - patient has a history of type 2 diabetes mellitus as is on Lantus  and sliding scale insulin. We'll continue these. - High A1c and place patient on consistent carbohydrate diet. - Monitor Accu-Cheks and adjust insulin doses accordingly - due to persistent Hyperglycemia increased lantus. - dose adjusted as per diabetic co-ordinator.  DISCHARGE CONDITIONS:   Stable.  CONSULTS OBTAINED:  Treatment Team:  Laurier Nancy,  MD  DRUG ALLERGIES:  No Known Allergies  DISCHARGE MEDICATIONS:   Current Discharge Medication List    START taking these medications   Details  doxycycline (VIBRA-TABS) 100 MG tablet Take 1 tablet (100 mg total) by mouth every 12 (twelve) hours. Qty: 12 tablet, Refills: 0    feeding supplement, ENSURE ENLIVE, (ENSURE ENLIVE) LIQD Take 237 mLs by mouth 2 (two) times daily between meals. Qty: 237 mL, Refills: 12    !! ipratropium-albuterol (DUONEB) 0.5-2.5 (3) MG/3ML SOLN Take 3 mLs by nebulization every 6 (six) hours as needed (SOB). Qty: 360 mL, Refills: 0     !! - Potential duplicate medications found. Please discuss with provider.    CONTINUE these medications which have CHANGED   Details  insulin glargine (LANTUS) 100 UNIT/ML injection Inject 0.25 mLs (25 Units total) into the skin daily. Qty: 10 mL, Refills: 11      CONTINUE these medications which have NOT CHANGED   Details  acetaminophen (TYLENOL) 500 MG tablet Take 1,000 mg by mouth every 4 (four) hours as needed.    ALPRAZolam (XANAX) 0.5 MG tablet Take 0.5 mg by mouth 2 (two) times daily as needed for anxiety.    carvedilol (COREG) 6.25 MG tablet Take 6.25 mg by mouth 2 (two) times daily with a meal.    divalproex (DEPAKOTE ER) 250 MG 24 hr tablet Take 250 mg by mouth 2 (two) times daily.    finasteride (PROSCAR) 5 MG tablet Take 5 mg by mouth daily.    furosemide (LASIX) 20 MG tablet Take 20 mg by mouth daily.    guaiFENesin-dextromethorphan (ROBITUSSIN DM) 100-10 MG/5ML syrup Take 10 mLs by mouth every 4 (four) hours as needed for cough.    hydrocortisone (ANUSOL-HC) 25 MG suppository Place 25 mg rectally at bedtime as needed for hemorrhoids or itching.    insulin lispro (HUMALOG) 100 UNIT/ML injection Inject 2-9 Units into the skin 3 (three) times daily before meals. Per sliding scale 0-99 0 units 100-149 2 units 150-199 3 units 200-249 4 units 250-299 5 units 300-349 6 units 350-399 7 units 400-449  8 units 450-499 9 units 500 or greater call MD    !! ipratropium-albuterol (DUONEB) 0.5-2.5 (3) MG/3ML SOLN Take 3 mLs by nebulization 3 (three) times daily.    lisinopril (PRINIVIL,ZESTRIL) 2.5 MG tablet Take 2.5 mg by mouth daily.    medroxyPROGESTERone (PROVERA) 5 MG tablet Take 5 mg by mouth daily.    montelukast (SINGULAIR) 10 MG tablet Take 10 mg by mouth at bedtime.    PARoxetine (PAXIL) 10 MG tablet Take 10 mg by mouth daily.    polyethylene glycol (MIRALAX / GLYCOLAX) packet Take 17 g by mouth every other day.    polyvinyl alcohol (LIQUIFILM TEARS) 1.4 % ophthalmic solution Place 2 drops into both eyes as needed for dry eyes.    risperiDONE (RISPERDAL) 0.5 MG tablet Take 0.5 mg by mouth at bedtime.    Rivaroxaban (XARELTO) 15 MG TABS tablet Take 15 mg by mouth daily with supper.    simvastatin (ZOCOR) 20 MG tablet Take 20 mg by mouth daily.    Skin Protectants, Misc. (EUCERIN) cream Apply 1 application topically  as needed for dry skin.    spironolactone (ALDACTONE) 25 MG tablet Take 12.5 mg by mouth daily.    tamsulosin (FLOMAX) 0.4 MG CAPS capsule Take 0.4 mg by mouth daily.     !! - Potential duplicate medications found. Please discuss with provider.    STOP taking these medications     amoxicillin-clavulanate (AUGMENTIN) 500-125 MG tablet          DISCHARGE INSTRUCTIONS:    Follow with PMD in 2 weeks.  If you experience worsening of your admission symptoms, develop shortness of breath, life threatening emergency, suicidal or homicidal thoughts you must seek medical attention immediately by calling 911 or calling your MD immediately  if symptoms less severe.  You Must read complete instructions/literature along with all the possible adverse reactions/side effects for all the Medicines you take and that have been prescribed to you. Take any new Medicines after you have completely understood and accept all the possible adverse reactions/side effects.   Please  note  You were cared for by a hospitalist during your hospital stay. If you have any questions about your discharge medications or the care you received while you were in the hospital after you are discharged, you can call the unit and asked to speak with the hospitalist on call if the hospitalist that took care of you is not available. Once you are discharged, your primary care physician will handle any further medical issues. Please note that NO REFILLS for any discharge medications will be authorized once you are discharged, as it is imperative that you return to your primary care physician (or establish a relationship with a primary care physician if you do not have one) for your aftercare needs so that they can reassess your need for medications and monitor your lab values.    Today   CHIEF COMPLAINT:   Chief Complaint  Patient presents with  . Respiratory Distress    HISTORY OF PRESENT ILLNESS:  John Thompson  is a 82 y.o. male with a known history of diabetes, hypertension, CVA, vascular dementia, chronic respiratory failure with hypoxia, COPD and atrial fibrillation. He presents to the ED from a skilled nursing facility [facility]) due to shortness of breath. Patient is reported to have been having worsening shortness of breath in the last 1 week and has been very weak. At 5:30 PM this evening, he developed significant shortness of breath which resulted in massive facility calling EMS. Patient has a history of vascular dementia and is unable to provide any history. History is obtained from ED staff and patient's daughter. The patient is reported to have been having significant swelling for the last 3 weeks to 1 month and swelling was located in the abdomen and legs. He has had a decreased appetite and has been getting weaker. He developed a cough about one week ago and cough was productive of yellowish sputum and also developed some wheezes. At the nursing facility, a chest x-ray was  obtained yesterday with unknown results. He was also said to have been diagnosed with acute bronchitis earlier today. Were unable to determine if patient had any orthopnea or PND. There is no history of recent travel or sick contacts. Influenza and pneumococcal vaccines are up-to-date. There is no reported palpitations, chest pain, nausea or vomiting and there is no change in bowel habits but patient is reported to have had an increased urine output in recent times with occasional incontinence. When EMS picked up the patient at the skilled nursing  facility, patient was significant for short of breath with an O2 sat of 86% and symmetrical was given as well as 3 doses of DuoNeb's.  On arrival in the ED, patient had a temperature 100.5 and was tachycardic at 112 with a respiratory rate of up to 31. CBC was unremarkable and CMP was significant for a BUN of 46 from baseline of 24 and creatinine of 2.41 from a baseline of 1.37. Troponin was elevated at 0.06 and lactic acid was negative. Urinalysis and culture pending. Chest x-ray showed cardiac megaly with pulmonary edema consistent with CHF with possible pneumonia. EKG showed A. fib with RVR. QT was prolonged at 514. Patient received azithromycin and Septra allergen in the ED and will be admitted due to acute on chronic respiratory failure with hypoxia, atrial fibrillation with RVR, prolonged QT, sepsis, healthcare associated pneumonia and acute on chronic renal failure.     VITAL SIGNS:  Blood pressure 108/69, pulse 94, temperature 98 F (36.7 C), temperature source Oral, resp. rate 20, height 5\' 8"  (1.727 m), weight 77.111 kg (170 lb), SpO2 89 %.  I/O:    Intake/Output Summary (Last 24 hours) at 08/13/15 1646 Last data filed at 08/13/15 1230  Gross per 24 hour  Intake    360 ml  Output    300 ml  Net     60 ml    PHYSICAL EXAMINATION:   GENERAL: 82 y.o.-year-old patient lying in the bed with no acute distress.  EYES: Pupils equal, round,  reactive to light and accommodation. No scleral icterus. Extraocular muscles intact.  HEENT: Head atraumatic, normocephalic. Oropharynx and nasopharynx clear.  NECK: Supple, no jugular venous distention. No thyroid enlargement, no tenderness.  LUNGS: Normal breath sounds bilaterally, mild wheezing, some crepitation. No use of accessory muscles of respiration. On nasal canula CARDIOVASCULAR: S1, S2 normal. No murmurs, rubs, or gallops.  ABDOMEN: Soft, nontender, nondistended. Bowel sounds present. No organomegaly or mass.  EXTREMITIES: No pedal edema, cyanosis, or clubbing.  NEUROLOGIC: Cranial nerves II through XII are intact. Muscle strength 3-4/5 in all extremities. Sensation intact. Gait not checked.  PSYCHIATRIC: The patient is alert and confused.  SKIN: No obvious rash, lesion, or ulcer.   DATA REVIEW:   CBC  Recent Labs Lab 08/11/15 0526  WBC 7.9  HGB 12.4*  HCT 37.3*  PLT 155    Chemistries   Recent Labs Lab 08/09/15 2037 08/10/15 0445  08/13/15 0438  NA 141 139  < > 143  K 4.7 4.9  < > 4.7  CL 112* 110  < > 114*  CO2 24 20*  < > 20*  GLUCOSE 137* 325*  < > 190*  BUN 46* 50*  < > 73*  CREATININE 2.41* 2.38*  < > 2.13*  CALCIUM 8.8* 8.6*  < > 8.4*  MG  --  2.7*  --   --   AST 23  --   --   --   ALT 17  --   --   --   ALKPHOS 55  --   --   --   BILITOT 0.7  --   --   --   < > = values in this interval not displayed.  Cardiac Enzymes  Recent Labs Lab 08/10/15 1302  TROPONINI 0.05*    Microbiology Results  Results for orders placed or performed during the hospital encounter of 08/09/15  Blood Culture (routine x 2)     Status: None (Preliminary result)   Collection Time: 08/09/15  9:15 PM  Result Value Ref Range Status   Specimen Description BLOOD LEFT ANTECUBITAL  Final   Special Requests BOTTLES DRAWN AEROBIC AND ANAEROBIC  Final   Culture NO GROWTH 4 DAYS  Final   Report Status PENDING  Incomplete  Blood Culture (routine x 2)      Status: None (Preliminary result)   Collection Time: 08/09/15  9:57 PM  Result Value Ref Range Status   Specimen Description BLOOD BLOOD LEFT FOREARM  Final   Special Requests BOTTLES DRAWN AEROBIC AND ANAEROBIC  Final   Culture NO GROWTH 4 DAYS  Final   Report Status PENDING  Incomplete  Urine culture     Status: None   Collection Time: 08/10/15  5:03 AM  Result Value Ref Range Status   Specimen Description URINE, RANDOM  Final   Special Requests NONE  Final   Culture MULTIPLE SPECIES PRESENT, SUGGEST RECOLLECTION  Final   Report Status 08/12/2015 FINAL  Final  MRSA PCR Screening     Status: Abnormal   Collection Time: 08/10/15  6:00 PM  Result Value Ref Range Status   MRSA by PCR POSITIVE (A) NEGATIVE Final    Comment:        The GeneXpert MRSA Assay (FDA approved for NASAL specimens only), is one component of a comprehensive MRSA colonization surveillance program. It is not intended to diagnose MRSA infection nor to guide or monitor treatment for MRSA infections. CRITICAL RESULT CALLED TO, READ BACK BY AND VERIFIED WITH: CHARLOTTE KYEI AT 1955 08/10/15 MLZ      RADIOLOGY:  Dg Chest 1 View  08/13/2015  CLINICAL DATA:  Pulmonary edema EXAM: CHEST 1 VIEW COMPARISON:  August 12, 2015 FINDINGS: There remains interstitial pulmonary edema. No alveolar consolidation. There is cardiomegaly with pulmonary venous hypertension. There is patchy atelectasis in the left lower lobe. No adenopathy. No bone lesions. IMPRESSION: Persistent congestive heart failure.  Left base atelectasis. Electronically Signed   By: Bretta Bang III M.D.   On: 08/13/2015 14:05   Dg Chest 2 View  08/12/2015  CLINICAL DATA:  Hypoxia. EXAM: CHEST  2 VIEW COMPARISON:  08/09/2015. FINDINGS: Cardiomegaly with diffuse bilateral from interstitial prominence consistent congestive heart failure. Small left pleural effusion cannot be excluded. Findings have progressed slightly from prior exam. No pneumothorax.  IMPRESSION: Cardiomegaly with diffuse bilateral from interstitial prominence consistent with congestive heart failure. Small left pleural effusion cannot be excluded. Findings have progressed slightly from prior exam. Electronically Signed   By: Maisie Fus  Register   On: 08/12/2015 08:10    EKG:   Orders placed or performed during the hospital encounter of 08/09/15  . EKG 12-Lead  . EKG 12-Lead      Management plans discussed with the patient, family and they are in agreement.  CODE STATUS: DNR    Code Status Orders        Start     Ordered   08/10/15 0915  Do not attempt resuscitation (DNR)   Continuous    Question Answer Comment  In the event of cardiac or respiratory ARREST Do not call a "code blue"   In the event of cardiac or respiratory ARREST Do not perform Intubation, CPR, defibrillation or ACLS   In the event of cardiac or respiratory ARREST Use medication by any route, position, wound care, and other measures to relive pain and suffering. May use oxygen, suction and manual treatment of airway obstruction as needed for comfort.      08/10/15 1610  Code Status History    Date Active Date Inactive Code Status Order ID Comments User Context   This patient has a current code status but no historical code status.    Advance Directive Documentation        Most Recent Value   Type of Advance Directive  Healthcare Power of Attorney   Pre-existing out of facility DNR order (yellow form or pink MOST form)     "MOST" Form in Place?        TOTAL TIME TAKING CARE OF THIS PATIENT: 35 minutes.    Altamese DillingVACHHANI, Kal Chait M.D on 08/13/2015 at 4:46 PM  Between 7am to 6pm - Pager - [phone]  After 6pm go to [email or web address] - password [email or web address]  [facility] [place] [facility]  Office  [phone]  CC: Primary care physician; Keane PoliceSLADE-HARTMAN, VENEZELA, MD   Note: This dictation was prepared with Dragon dictation along with smaller phrase technology. Any transcriptional errors  that result from this process are unintentional.

## 2015-08-13 NOTE — Clinical Documentation Improvement (Signed)
Internal Medicine  Can the diagnosis of CKD be further specified?   CKD Stage I - GFR greater than or equal to 90  CKD Stage II - GFR 60-89  CKD Stage III - GFR 30-59  CKD Stage IV - GFR 15-29  CKD Stage V - GFR < 15  ESRD (End Stage Renal Disease)  Other condition  Unable to clinically determine   Supporting Information: : (risk factors, signs and symptoms, diagnostics, treatment) 4717: BUN= 69; Crea= 2.18; GFR= 30  Please exercise your independent, professional judgment when responding. A specific answer is not anticipated or expected.   Thank You, Cherylann Ratelonna J Myrle Dues, RN, BSN Health Information Management [place] [phone]

## 2015-08-13 NOTE — Progress Notes (Signed)
EMS here for non emergent transfer to [facility].  Daughter, Alona BeneJoyce, called and updated on pts transfer

## 2015-08-13 NOTE — Care Management Important Message (Signed)
Important Message  Patient Details  Name: Merita NortonJames Gorter MRN: [id number] Date of Birth: 09-18-1928   Medicare Important Message Given:  Yes    Marily MemosLisa M Azayla Polo, RN 08/13/2015, 8:47 AM

## 2015-08-13 NOTE — Progress Notes (Signed)
SUBJECTIVE: Patient is feeling much better   Filed Vitals:   08/12/15 1713 08/12/15 2000 08/13/15 0526 08/13/15 0838  BP: 116/75 109/65 111/70 114/72  Pulse: 87 98 67 83  Temp:  97.7 F (36.5 C) 98.1 F (36.7 C) 97.9 F (36.6 C)  TempSrc:  Oral Oral Oral  Resp:  16 18 18   Height:      Weight:   170 lb (77.111 kg)   SpO2:  96% 100% 100%    Intake/Output Summary (Last 24 hours) at 08/13/15 0900 Last data filed at 08/13/15 0531  Gross per 24 hour  Intake      0 ml  Output    300 ml  Net   -300 ml    LABS: Basic Metabolic Panel:  Recent Labs  16/02/9603/06/17 0841 08/13/15 0438  NA 140 143  K 4.5 4.7  CL 111 114*  CO2 22 20*  GLUCOSE 195* 190*  BUN 69* 73*  CREATININE 2.18* 2.13*  CALCIUM 8.1* 8.4*   Liver Function Tests: No results for input(s): AST, ALT, ALKPHOS, BILITOT, PROT, ALBUMIN in the last 72 hours. No results for input(s): LIPASE, AMYLASE in the last 72 hours. CBC:  Recent Labs  08/11/15 0526  WBC 7.9  HGB 12.4*  HCT 37.3*  MCV 78.2*  PLT 155   Cardiac Enzymes:  Recent Labs  08/10/15 1302  TROPONINI 0.05*   BNP: Invalid input(s): POCBNP D-Dimer: No results for input(s): DDIMER in the last 72 hours. Hemoglobin A1C: No results for input(s): HGBA1C in the last 72 hours. Fasting Lipid Panel: No results for input(s): CHOL, HDL, LDLCALC, TRIG, CHOLHDL, LDLDIRECT in the last 72 hours. Thyroid Function Tests: No results for input(s): TSH, T4TOTAL, T3FREE, THYROIDAB in the last 72 hours.  Invalid input(s): FREET3 Anemia Panel: No results for input(s): VITAMINB12, FOLATE, FERRITIN, TIBC, IRON, RETICCTPCT in the last 72 hours.   PHYSICAL EXAM General: Well developed, well nourished, in no acute distress HEENT:  Normocephalic and atramatic Neck:  No JVD.  Lungs: Clear bilaterally to auscultation and percussion. Heart: HRRR . Normal S1 and S2 without gallops or murmurs.  Abdomen: Bowel sounds are positive, abdomen soft and non-tender  Msk:  Back  normal, normal gait. Normal strength and tone for age. Extremities: No clubbing, cyanosis or edema.   Neuro: Alert and oriented X 3. Psych:  Good affect, responds appropriately  TELEMETRY:Thompson. fib  ASSESSMENT AND PLAN: Congestive heart failure with Thompson. fib and severe LV dysfunction. We will gradually increase carvedilol and may need to add and entresto. Problem is patient has renal insufficiency Principal Problem:   Acute on chronic respiratory failure with hypoxia (HCC) Active Problems:   CHF exacerbation (HCC)   Healthcare-associated pneumonia   Sepsis (HCC)   Elevated troponin   Prolonged Q-T interval on ECG   Atrial fibrillation with RVR (HCC)   Acute on chronic renal insufficiency (HCC)   Type 2 diabetes mellitus (HCC)   Atrial fibrillation with rapid ventricular response (HCC)    John Clauson A, MD, [facility] Hospital[facility]ACC 08/13/2015 9:00 AM

## 2015-08-13 NOTE — Discharge Instructions (Signed)
Heart Failure Clinic appointment on September 03, 2015 at 9:00am with Clarisa Kindredina Hackney, FNP. Please call [phone] to reschedule.   CPAP at night time .

## 2015-08-13 NOTE — Procedures (Signed)
This is a echocardiogram on patient name John Thompson. Findings left ventricular ejection fraction 34% with moderately dilated left ventricle. Left atrium is also moderately dilated along with the right atrium. Right ventricle is mildly dilated. There is fibrocalcific 5 aortic valve without aortic stenosis. There is no evidence of aortic regurgitation. There is mild mitral regurgitation and mild tricuspid regurgitation. In conclusion patient has four-chamber dilated patient with severe left ventricle systolic function. Diastolic dysfunction was unable to be evaluated due to atrial fibrillation.

## 2015-08-13 NOTE — Clinical Social Work Note (Signed)
Clinical Social Work Assessment  Patient Details  Name: Merita NortonJames Lundin MRN: [id number] Date of Birth: 1928-08-10  Date of referral:  08/13/15               Reason for consult:  Facility Placement                Permission sought to share information with:   (patient confused with dementia) Permission granted to share information::     Name::        Agency::     Relationship::     Contact Information:     Housing/Transportation Living arrangements for the past 2 months:  Skilled Nursing Facility Source of Information:  Adult Children Patient Interpreter Needed:  None Criminal Activity/Legal Involvement Pertinent to Current Situation/Hospitalization:  No - Comment as needed Significant Relationships:  Adult Children Lives with:  Facility Resident Do you feel safe going back to the place where you live?    Need for family participation in patient care:  Yes (Comment)  Care giving concerns:  [facility].    Social Worker assessment / plan:  CSW spoke at length with patient's daughter: Ulice BoldJoyce Montague: [phone] and Bonita QuinLinda throughout the day today. Physician was ready to discharge patient and patient's daughter, Ms. Tressie EllisMontague, did not [facility] because she stated that they allowed fluid to build up to where he gained 35 lbs in fluid before they sent him to the hospital. Patient's daughter requested a new bedsearch and stated that she was willing to pay out of pocket to keep patient in the hospital until a bed became available. Initially it was thought patient may need to go into a facility under medicaid but patient is able to go into a facility under medicare as he has recouped his days and then can transition to medicaid. New bedsearch was initiated and only one offer came in and that was from [facility]. Patient's daughter had informed me that [facility] but would not elaborate. [facility]  and she stated that patient could be sexually inappropriate with staff. Stanton KidneyDebra clarified that it was only with staff and not with residents in the facility. She stated he would try to touch their chests and bottoms. CSW relayed this to Jomarie LongsJoseph at [facility] and they were still able to offer the bed. Ms. Tressie EllisMontague wanted to wait for her sister, Bonita QuinLinda, to get off work before a decision was made. She called me at 4:40 to say that they have chosen [facility]. They are aware that patient will transfer this afternoon and they requested EMS.  Patient's daughters wanted to ensure that [facility] would monitor patient's diet and fluid overload. CSW has spoken to [place]burnJoseph at [facility] personally about this concern and he stated they will monitor it closely.  Employment status:  Retired Database administratornsurance information:  Managed Medicare PT Recommendations:  Not assessed at this time Information / Referral to community resources:  Skilled Nursing Facility  Patient/Family's Response to care:  Patient's daughters were very grateful for CSW assistance.  Patient/Family's Understanding of and Emotional Response to Diagnosis, Current Treatment, and Prognosis:  Patient's daughters were very honest with patient's behavior and why they did not want him to [facility] to [facility].   Emotional Assessment Appearance:  Appears stated age Attitude/Demeanor/Rapport:  Unable to Assess Affect (typically observed):  Unable to Assess Orientation:  Oriented to Self Alcohol /  Substance use:  Not Applicable Psych involvement (Current and /or in the community):  No (Comment)  Discharge Needs  Concerns to be addressed:  Care Coordination Readmission within the last 30 days:  No Current discharge risk:  None Barriers to Discharge:  No Barriers Identified   York Spaniel, LCSW 08/13/2015, 5:17 PM

## 2015-08-13 NOTE — Progress Notes (Signed)
Back from echo

## 2015-08-13 NOTE — Progress Notes (Signed)
*  PRELIMINARY RESULTS* Echocardiogram 2D Echocardiogram has been performed.  John Thompson 08/13/2015, 7:54 AM

## 2015-08-13 NOTE — Progress Notes (Signed)
Report called to Kim at [facility] 

## 2015-08-13 NOTE — Progress Notes (Signed)
Back from xray

## 2015-08-13 NOTE — Progress Notes (Signed)
[facility] [place] at [facility]   PATIENT NAME: John Thompson    MR#:  [id number]  DATE OF BIRTH:  [birth year]  SUBJECTIVE:  CHIEF COMPLAINT:   Chief Complaint  Patient presents with  . Respiratory Distress   Brought from NH for worsening mental status, and SOB.   Required Bipap initialy, much improved, awake today, but appears confused. comfortable on nasal canula.   Due to dementia, not able to give any more details, but having good conversations.   When he is sleeping- we noted him having apneic spells of up to 10-20 seconds- followed by rapid breathing. Will give Cpap at night- if he tolerates.  REVIEW OF SYSTEMS:   Pt is confused. Not able to get ROS.  ROS  DRUG ALLERGIES:  No Known Allergies  VITALS:  Blood pressure 114/72, pulse 83, temperature 97.9 F (36.6 C), temperature source Oral, resp. rate 18, height  (1.727 m), weight 77.111 kg (170 lb), SpO2 100 %.  PHYSICAL EXAMINATION:  GENERAL:  82 y.o.-year-old patient lying in the bed with no acute distress.  EYES: Pupils equal, round, reactive to light and accommodation. No scleral icterus. Extraocular muscles intact.  HEENT: Head atraumatic, normocephalic. Oropharynx and nasopharynx clear.  NECK:  Supple, no jugular venous distention. No thyroid enlargement, no tenderness.  LUNGS: Normal breath sounds bilaterally, mild wheezing, some crepitation. No use of accessory muscles of respiration. On nasal canula CARDIOVASCULAR: S1, S2 normal. No murmurs, rubs, or gallops.  ABDOMEN: Soft, nontender, nondistended. Bowel sounds present. No organomegaly or mass.  EXTREMITIES: No pedal edema, cyanosis, or clubbing.  NEUROLOGIC: Cranial nerves II through XII are intact. Muscle strength 3-4/5 in all extremities. Sensation intact. Gait not checked.  PSYCHIATRIC: The patient is alert and confused.  SKIN: No obvious rash, lesion, or ulcer.   Physical Exam LABORATORY PANEL:   CBC  Recent Labs Lab  08/11/15 0526  WBC 7.9  HGB 12.4*  HCT 37.3*  PLT 155   ------------------------------------------------------------------------------------------------------------------  Chemistries   Recent Labs Lab 08/09/15 2037 08/10/15 0445  08/13/15 0438  NA 141 139  < > 143  K 4.7 4.9  < > 4.7  CL 112* 110  < > 114*  CO2 24 20*  < > 20*  GLUCOSE 137* 325*  < > 190*  BUN 46* 50*  < > 73*  CREATININE 2.41* 2.38*  < > 2.13*  CALCIUM 8.8* 8.6*  < > 8.4*  MG  --  2.7*  --   --   AST 23  --   --   --   ALT 17  --   --   --   ALKPHOS 55  --   --   --   BILITOT 0.7  --   --   --   < > = values in this interval not displayed. ------------------------------------------------------------------------------------------------------------------  Cardiac Enzymes  Recent Labs Lab 08/10/15 0824 08/10/15 1302  TROPONINI 0.03 0.05*   ------------------------------------------------------------------------------------------------------------------  RADIOLOGY:  Dg Chest 2 View  08/12/2015  CLINICAL DATA:  Hypoxia. EXAM: CHEST  2 VIEW COMPARISON:  08/09/2015. FINDINGS: Cardiomegaly with diffuse bilateral from interstitial prominence consistent congestive heart failure. Small left pleural effusion cannot be excluded. Findings have progressed slightly from prior exam. No pneumothorax. IMPRESSION: Cardiomegaly with diffuse bilateral from interstitial prominence consistent with congestive heart failure. Small left pleural effusion cannot be excluded. Findings have progressed slightly from prior exam. Electronically Signed   By: Maisie Fus  Register   On: 08/12/2015 08:10  ASSESSMENT AND PLAN:   Principal Problem:   Acute on chronic respiratory failure with hypoxia (HCC) Active Problems:   CHF exacerbation (HCC)   Healthcare-associated pneumonia   Sepsis (HCC)   Elevated troponin   Prolonged Q-T interval on ECG   Atrial fibrillation with RVR (HCC)   Acute on chronic renal insufficiency (HCC)    Type 2 diabetes mellitus (HCC)   Atrial fibrillation with rapid ventricular response (HCC)  1). Acute respiratory failure with hypoxia secondary to CHF exacerbation with elevated troponin -  He was diagnosed with acute bronchitis in NH. Chest x-ray is consistent with pulmonary edema. - Patient received 40 mg of Lasix in the ED.  - very good response and no leg edema now as per daughter- much better. -  cont small dose lasix now due to worsening renal func. - stable on follow up troponin. Initial troponin was elevated at 0.06. - Continue supplementary oxygen, DuoNeb's - there is no information on and 2-D echo in the chart. Obtain 2-D echo - continue spironolactone, lisinopril, Coreg and statin. - Measure daily weights, input and output and ensure fluid restriction  - Checked lipid panel and A1c  - appreciated cardiology help. - i/o not properly charted, but pt clinically much better. - for sleep apnea- will try cpap - if pt tolerates, though pt is demented and I doubt he will comply.  2). Healthcare associated pneumonia with early sepsis - patient presents with clinical syndrome consistent with pneumonia and chest x-ray suggest pneumonia. - on vancomycin, doxycycline and ceftriaxone. - Follow results of blood and sputum cultures , MRSA screen positive. - Continue supplementary oxygen, DuoNeb and guaifenesin - incentive spirometry  - d/c vanc due to renal failure. - swallow eval done on dysphagia diet, switch to oral Abx.  3). A. fib with RVR - patient has a baseline A. fib but has been going into tachycardia with up to 140 bpm.  - Slowed down spontaneously over few hours without requirement of Cardizem drip. - normal TSH - Continue Rivaroxaban - continue coreg. Increased dose as per cardiology.  4). Acute on chronic renal insufficiency - patient has a creatinine of 2.41 from a baseline of 1.37. This is most likely due to congestion. Continue diuresis and patient and monitor  creatinine levels. - stopped Vanc, low dose lasix, monitor.- renal func stable.  5). Prolonged QT - QT is prolonged at 514. Patient is on risperidone and SSRI and was given azithromycin in the ED. - Discontinue risperidone and azithromycin and start patient on doxycycline in place of azithromycin. - normal magnesium level and monitor QT closely.  6). Type 2 diabetes mellitus - patient has a history of type 2 diabetes mellitus as is on Lantus and sliding scale insulin. We'll continue these. - High A1c and place patient on consistent carbohydrate diet. - Monitor Accu-Cheks and adjust insulin doses accordingly - due to persistent Hyperglycemia increased lantus. - dose adjusted as per diabetic co-ordinator.   All the records are reviewed and case discussed with Care Management/Social Workerr. Management plans discussed with the patient, family and they are in agreement.  CODE STATUS: DNR  TOTAL TIME TAKING CARE OF THIS PATIENT: 35 minutes.  i spoke to his daughter on phone.  POSSIBLE D/C IN 2-3 DAYS, DEPENDING ON CLINICAL CONDITION.   Altamese Dilling M.D on 08/13/2015   Between 7am to 6pm - Pager - [phone]  After 6pm go to [email or web address] - Social research officer, government  [facility]  Office  [phone]  CC:  Primary care physician; Keane PoliceSLADE-HARTMAN, VENEZELA, MD  Note: This dictation was prepared with Dragon dictation along with smaller phrase technology. Any transcriptional errors that result from this process are unintentional.

## 2015-08-13 NOTE — Plan of Care (Signed)
Problem: SLP Dysphagia Goals Goal: Misc Dysphagia Goal Pt will safely tolerate po diet of least restrictive consistency w/ no overt s/s of aspiration noted by Staff/pt/family x3 sessions.    

## 2015-08-13 NOTE — Progress Notes (Signed)
To xray via bed 

## 2015-08-13 NOTE — Progress Notes (Signed)
To echo via bed.

## 2015-08-13 NOTE — Progress Notes (Signed)
Inpatient Diabetes Program Recommendations  AACE/ADA: New Consensus Statement on Inpatient Glycemic Control (2015)  Target Ranges:  Prepandial:   less than 140 mg/dL      Peak postprandial:   less than 180 mg/dL (1-2 hours)      Critically ill patients:  140 - 180 mg/dL   Review of Glycemic Control  Results for Merita NortonWOODS, Jimie (MRN [id number]) as of 08/13/2015 09:03  Ref. Range 08/12/2015 07:47 08/12/2015 11:30 08/12/2015 16:32 08/12/2015 20:57 08/13/2015 08:12  Glucose-Capillary Latest Ref Range: 65-99 mg/dL 409169 (H) 811241 (H) 914171 (H) 211 (H) 176 (H)    Diabetes history: Type 2 with decreased renal function Outpatient Diabetes medications: Lantus 18 units qhs, Novolog 2-9 units tid Current orders for Inpatient glycemic control: Lantus 25 units qday, Novolog 0-15 units tid and hs  Inpatient Diabetes Program Recommendations:  Blood sugars OK- would not increase insulin doses.    Susette RacerJulie Emogene Muratalla, RN, BA, MHA, CDE Diabetes Coordinator Inpatient Diabetes Program  [phone] (Team Pager) [phone] [facility] Office) 08/13/2015 9:03 AM

## 2015-08-13 NOTE — Consult Note (Signed)
   [facility] CM Inpatient Consult   08/13/2015  Merita NortonJames Yoon 02/20/1929 [id number]   Patient screened for potential [facility] Care Management services. Patient is eligible for [facility] Services. Epic reveals patient's discharge plan is SNF. [facility] Care Management services not appropriate at this time. If patient's post hospital needs change please place a [facility] Care Management consult. For questions please contact:   Brayli Klingbeil RN, BSN [facility]  Hospital Liaison  [phone]) Business Mobile [phone]) Toll free office

## 2015-08-13 NOTE — Plan of Care (Signed)
Problem: Education: Goal: Knowledge of [place] [facility] Education information/materials will improve Outcome: Not Progressing Pt with dementia  Problem: Safety: Goal: Ability to remain free from injury will improve Outcome: Progressing Fall precautions in place  Problem: Health Behavior/Discharge Planning: Goal: Ability to manage health-related needs will improve Dementia  Problem: Tissue Perfusion: Goal: Risk factors for ineffective tissue perfusion will decrease Outcome: Progressing PO Xarelto  Problem: Education: Goal: Knowledge of disease or condition will improve Outcome: Not Progressing Pt has dementia Goal: Knowledge of the prescribed therapeutic regimen will improve Outcome: Not Progressing dementia

## 2015-08-13 NOTE — Clinical Social Work Placement (Signed)
   CLINICAL SOCIAL WORK PLACEMENT  NOTE  Date:  08/13/2015  Patient Details  Name: John NortonJames Stettler MRN: [id number] Date of Birth: 04/25/1929  Clinical Social Work is seeking post-discharge placement for this patient at the Skilled  Nursing Facility level of care (*CSW will initial, date and re-position this form in  chart as items are completed):  Yes   Patient/family provided with [facility] Clinical Social Work Department's list of facilities offering this level of care within the geographic area requested by the patient (or if unable, by the patient's family).  Yes   Patient/family informed of their freedom to choose among providers that offer the needed level of care, that participate in Medicare, Medicaid or managed care program needed by the patient, have an available bed and are willing to accept the patient.  Yes   Patient/family informed of [facility]'s ownership interest in [facility] and [facility], as well as of the fact that they are under no obligation to receive care at these facilities.  PASRR submitted to EDS on       PASRR number received on       Existing PASRR number confirmed on 08/13/15     FL2 transmitted to all facilities in geographic area requested by pt/family on 08/13/15     FL2 transmitted to all facilities within larger geographic area on       Patient informed that his/her managed care company has contracts with or will negotiate with certain facilities, including the following:        Yes   Patient/family informed of bed offers received.  Patient chooses bed at  [facility])     Physician recommends and patient chooses bed at  [facility](SNF)    Patient to be transferred to  ([facility]) on 08/13/15.  Patient to be transferred to facility by  (EMS)     Patient family notified on 08/13/15 of transfer.  Name of family member notified:  both daughters: Alona BeneJoyce and [facility] Incinda     PHYSICIAN       Additional Comment:     _______________________________________________ York SpanielMonica Chue Berkovich, LCSW 08/13/2015, 5:28 PM

## 2015-08-13 NOTE — Progress Notes (Signed)
EMS called for non emergent transfer to [facility]

## 2015-08-14 LAB — CULTURE, BLOOD (ROUTINE X 2)
CULTURE: NO GROWTH
Culture: NO GROWTH

## 2015-08-16 LAB — ECHOCARDIOGRAM COMPLETE
HEIGHTINCHES: 68 in
Weight: 2720 oz

## 2015-08-25 ENCOUNTER — Emergency Department: Payer: Medicare Other

## 2015-08-25 ENCOUNTER — Inpatient Hospital Stay
Admission: EM | Admit: 2015-08-25 | Discharge: 2015-09-06 | DRG: 208 | Disposition: E | Payer: Medicare Other | Attending: Internal Medicine | Admitting: Internal Medicine

## 2015-08-25 ENCOUNTER — Encounter: Payer: Self-pay | Admitting: *Deleted

## 2015-08-25 DIAGNOSIS — E872 Acidosis, unspecified: Secondary | ICD-10-CM

## 2015-08-25 DIAGNOSIS — R680 Hypothermia, not associated with low environmental temperature: Secondary | ICD-10-CM | POA: Diagnosis present

## 2015-08-25 DIAGNOSIS — I959 Hypotension, unspecified: Secondary | ICD-10-CM | POA: Diagnosis present

## 2015-08-25 DIAGNOSIS — I130 Hypertensive heart and chronic kidney disease with heart failure and stage 1 through stage 4 chronic kidney disease, or unspecified chronic kidney disease: Secondary | ICD-10-CM | POA: Diagnosis present

## 2015-08-25 DIAGNOSIS — R579 Shock, unspecified: Secondary | ICD-10-CM | POA: Diagnosis not present

## 2015-08-25 DIAGNOSIS — N183 Chronic kidney disease, stage 3 (moderate): Secondary | ICD-10-CM | POA: Diagnosis present

## 2015-08-25 DIAGNOSIS — J9611 Chronic respiratory failure with hypoxia: Secondary | ICD-10-CM | POA: Diagnosis present

## 2015-08-25 DIAGNOSIS — I429 Cardiomyopathy, unspecified: Secondary | ICD-10-CM | POA: Diagnosis present

## 2015-08-25 DIAGNOSIS — G309 Alzheimer's disease, unspecified: Secondary | ICD-10-CM | POA: Diagnosis present

## 2015-08-25 DIAGNOSIS — Z87891 Personal history of nicotine dependence: Secondary | ICD-10-CM | POA: Diagnosis not present

## 2015-08-25 DIAGNOSIS — E870 Hyperosmolality and hypernatremia: Secondary | ICD-10-CM | POA: Diagnosis present

## 2015-08-25 DIAGNOSIS — I5023 Acute on chronic systolic (congestive) heart failure: Secondary | ICD-10-CM | POA: Diagnosis present

## 2015-08-25 DIAGNOSIS — Z515 Encounter for palliative care: Secondary | ICD-10-CM | POA: Diagnosis not present

## 2015-08-25 DIAGNOSIS — D62 Acute posthemorrhagic anemia: Secondary | ICD-10-CM | POA: Diagnosis not present

## 2015-08-25 DIAGNOSIS — Z66 Do not resuscitate: Secondary | ICD-10-CM | POA: Diagnosis not present

## 2015-08-25 DIAGNOSIS — I4891 Unspecified atrial fibrillation: Secondary | ICD-10-CM | POA: Diagnosis present

## 2015-08-25 DIAGNOSIS — E1165 Type 2 diabetes mellitus with hyperglycemia: Secondary | ICD-10-CM | POA: Diagnosis present

## 2015-08-25 DIAGNOSIS — F0150 Vascular dementia without behavioral disturbance: Secondary | ICD-10-CM | POA: Diagnosis present

## 2015-08-25 DIAGNOSIS — J9600 Acute respiratory failure, unspecified whether with hypoxia or hypercapnia: Secondary | ICD-10-CM | POA: Diagnosis present

## 2015-08-25 DIAGNOSIS — F0391 Unspecified dementia with behavioral disturbance: Secondary | ICD-10-CM | POA: Diagnosis not present

## 2015-08-25 DIAGNOSIS — K922 Gastrointestinal hemorrhage, unspecified: Secondary | ICD-10-CM | POA: Diagnosis not present

## 2015-08-25 DIAGNOSIS — Z7901 Long term (current) use of anticoagulants: Secondary | ICD-10-CM | POA: Diagnosis not present

## 2015-08-25 DIAGNOSIS — J449 Chronic obstructive pulmonary disease, unspecified: Secondary | ICD-10-CM | POA: Diagnosis present

## 2015-08-25 DIAGNOSIS — G9341 Metabolic encephalopathy: Secondary | ICD-10-CM | POA: Diagnosis present

## 2015-08-25 DIAGNOSIS — Z794 Long term (current) use of insulin: Secondary | ICD-10-CM | POA: Diagnosis not present

## 2015-08-25 DIAGNOSIS — Z79899 Other long term (current) drug therapy: Secondary | ICD-10-CM

## 2015-08-25 DIAGNOSIS — R571 Hypovolemic shock: Secondary | ICD-10-CM | POA: Diagnosis not present

## 2015-08-25 DIAGNOSIS — J690 Pneumonitis due to inhalation of food and vomit: Secondary | ICD-10-CM | POA: Diagnosis present

## 2015-08-25 DIAGNOSIS — T68XXXA Hypothermia, initial encounter: Secondary | ICD-10-CM

## 2015-08-25 DIAGNOSIS — K921 Melena: Secondary | ICD-10-CM | POA: Diagnosis present

## 2015-08-25 DIAGNOSIS — E1122 Type 2 diabetes mellitus with diabetic chronic kidney disease: Secondary | ICD-10-CM | POA: Diagnosis present

## 2015-08-25 DIAGNOSIS — N179 Acute kidney failure, unspecified: Secondary | ICD-10-CM | POA: Diagnosis present

## 2015-08-25 DIAGNOSIS — J9601 Acute respiratory failure with hypoxia: Secondary | ICD-10-CM | POA: Diagnosis not present

## 2015-08-25 DIAGNOSIS — K625 Hemorrhage of anus and rectum: Secondary | ICD-10-CM

## 2015-08-25 DIAGNOSIS — Z8673 Personal history of transient ischemic attack (TIA), and cerebral infarction without residual deficits: Secondary | ICD-10-CM

## 2015-08-25 LAB — COMPREHENSIVE METABOLIC PANEL
ALBUMIN: 2.3 g/dL — AB (ref 3.5–5.0)
ALK PHOS: 60 U/L (ref 38–126)
ALT: 18 U/L (ref 17–63)
ANION GAP: 13 (ref 5–15)
AST: 27 U/L (ref 15–41)
BILIRUBIN TOTAL: 0.7 mg/dL (ref 0.3–1.2)
BUN: 141 mg/dL — AB (ref 6–20)
CALCIUM: 8.2 mg/dL — AB (ref 8.9–10.3)
CO2: 17 mmol/L — AB (ref 22–32)
CREATININE: 6.66 mg/dL — AB (ref 0.61–1.24)
Chloride: 130 mmol/L — ABNORMAL HIGH (ref 101–111)
GFR calc Af Amer: 8 mL/min — ABNORMAL LOW (ref >60)
GFR calc non Af Amer: 7 mL/min — ABNORMAL LOW (ref >60)
GLUCOSE: 173 mg/dL — AB (ref 65–99)
Potassium: 7.5 mmol/L (ref 3.5–5.1)
SODIUM: 160 mmol/L — AB (ref 135–145)
Total Protein: 7.4 g/dL (ref 6.5–8.1)

## 2015-08-25 LAB — URINALYSIS COMPLETE WITH MICROSCOPIC (ARMC ONLY)
BILIRUBIN URINE: NEGATIVE
GLUCOSE, UA: NEGATIVE mg/dL
Leukocytes, UA: NEGATIVE
Nitrite: NEGATIVE
Protein, ur: 100 mg/dL — AB
Specific Gravity, Urine: 1.02 (ref 1.005–1.030)
pH: 5 (ref 5.0–8.0)

## 2015-08-25 LAB — BLOOD GAS, ARTERIAL
Acid-base deficit: 11.6 mmol/L — ABNORMAL HIGH (ref 0.0–2.0)
Bicarbonate: 14.4 mEq/L — ABNORMAL LOW (ref 21.0–28.0)
FIO2: 70
LHR: 20 {breaths}/min
O2 Saturation: 86.1 %
PEEP: 5 cmH2O
Patient temperature: 37
VT: 500 mL
pCO2 arterial: 32 mmHg (ref 32.0–48.0)
pH, Arterial: 7.26 — ABNORMAL LOW (ref 7.350–7.450)
pO2, Arterial: 60 mmHg — ABNORMAL LOW (ref 83.0–108.0)

## 2015-08-25 LAB — CBC WITH DIFFERENTIAL/PLATELET
BAND NEUTROPHILS: 0 %
BASOS PCT: 0 %
Basophils Absolute: 0 10*3/uL (ref 0–0.1)
Blasts: 0 %
EOS ABS: 0 10*3/uL (ref 0–0.7)
EOS PCT: 0 %
HCT: 35.6 % — ABNORMAL LOW (ref 40.0–52.0)
Hemoglobin: 11.3 g/dL — ABNORMAL LOW (ref 13.0–18.0)
LYMPHS ABS: 4.4 10*3/uL — AB (ref 1.0–3.6)
Lymphocytes Relative: 23 %
MCH: 25.6 pg — AB (ref 26.0–34.0)
MCHC: 31.6 g/dL — AB (ref 32.0–36.0)
MCV: 80.8 fL (ref 80.0–100.0)
MONO ABS: 1.1 10*3/uL — AB (ref 0.2–1.0)
MYELOCYTES: 0 %
Metamyelocytes Relative: 0 %
Monocytes Relative: 6 %
NEUTROS PCT: 71 %
NRBC: 0 /100{WBCs}
Neutro Abs: 13.5 10*3/uL — ABNORMAL HIGH (ref 1.4–6.5)
Other: 0 %
PLATELETS: 261 10*3/uL (ref 150–440)
Promyelocytes Absolute: 0 %
RBC: 4.41 MIL/uL (ref 4.40–5.90)
RDW: 16.6 % — ABNORMAL HIGH (ref 11.5–14.5)
WBC: 19 10*3/uL — ABNORMAL HIGH (ref 3.8–10.6)

## 2015-08-25 LAB — PROTIME-INR
INR: 2
PROTHROMBIN TIME: 22.6 s — AB (ref 11.4–15.0)

## 2015-08-25 LAB — GLUCOSE, CAPILLARY
GLUCOSE-CAPILLARY: 213 mg/dL — AB (ref 65–99)
GLUCOSE-CAPILLARY: 278 mg/dL — AB (ref 65–99)

## 2015-08-25 LAB — CARBOXYHEMOGLOBIN
Carboxyhemoglobin: 4.8 % (ref 1.5–9.0)
METHEMOGLOBIN: 0 %
O2 SAT: 86.1 %
TOTAL OXYGEN CONTENT: 86.8 mL/dL

## 2015-08-25 LAB — LACTIC ACID, PLASMA
LACTIC ACID, VENOUS: 2.7 mmol/L — AB (ref 0.5–2.0)
Lactic Acid, Venous: 3 mmol/L (ref 0.5–2.0)

## 2015-08-25 LAB — TROPONIN I: Troponin I: 0.13 ng/mL — ABNORMAL HIGH (ref <0.031)

## 2015-08-25 LAB — PREPARE RBC (CROSSMATCH)

## 2015-08-25 LAB — APTT: aPTT: 43 seconds — ABNORMAL HIGH (ref 24–36)

## 2015-08-25 MED ORDER — CHLORHEXIDINE GLUCONATE 0.12% ORAL RINSE (MEDLINE KIT)
15.0000 mL | Freq: Two times a day (BID) | OROMUCOSAL | Status: DC
  Administered 2015-08-26 (×2): 15 mL via OROMUCOSAL
  Filled 2015-08-25 (×4): qty 15

## 2015-08-25 MED ORDER — PANTOPRAZOLE SODIUM 40 MG IV SOLR: 40.0000 mg | Freq: Every day | INTRAVENOUS | Status: DC

## 2015-08-25 MED ORDER — NOREPINEPHRINE BITARTRATE 1 MG/ML IV SOLN: 0.0000 ug/min | INTRAVENOUS | Status: DC

## 2015-08-25 MED ORDER — SODIUM CHLORIDE 0.9 % IV SOLN
80.0000 mg | Freq: Once | INTRAVENOUS | Status: AC
  Administered 2015-08-25: 80 mg via INTRAVENOUS
  Filled 2015-08-25: qty 80

## 2015-08-25 MED ORDER — NOREPINEPHRINE BITARTRATE 1 MG/ML IV SOLN
0.0000 ug/min | Freq: Once | INTRAVENOUS | Status: DC
  Filled 2015-08-25: qty 4

## 2015-08-25 MED ORDER — PANTOPRAZOLE SODIUM 40 MG IV SOLR: 40.0000 mg | Freq: Two times a day (BID) | INTRAVENOUS | Status: DC

## 2015-08-25 MED ORDER — ANTISEPTIC ORAL RINSE SOLUTION (CORINZ)
7.0000 mL | Freq: Four times a day (QID) | OROMUCOSAL | Status: DC
  Administered 2015-08-26 (×4): 7 mL via OROMUCOSAL
  Filled 2015-08-25 (×6): qty 7

## 2015-08-25 MED ORDER — SODIUM CHLORIDE 0.9 % IV SOLN: 250.0000 mL | INTRAVENOUS | Status: DC | PRN

## 2015-08-25 MED ORDER — PIPERACILLIN-TAZOBACTAM 3.375 G IVPB
3.3750 g | Freq: Once | INTRAVENOUS | Status: AC
  Administered 2015-08-25: 3.375 g via INTRAVENOUS
  Filled 2015-08-25: qty 50

## 2015-08-25 MED ORDER — VANCOMYCIN HCL IN DEXTROSE 1-5 GM/200ML-% IV SOLN
1000.0000 mg | INTRAVENOUS | Status: DC
Start: 2015-08-26 — End: 2015-08-26
  Administered 2015-08-26: 1000 mg via INTRAVENOUS
  Filled 2015-08-25: qty 200

## 2015-08-25 MED ORDER — PIPERACILLIN-TAZOBACTAM 3.375 G IVPB
3.3750 g | Freq: Two times a day (BID) | INTRAVENOUS | Status: DC
  Filled 2015-08-25: qty 50

## 2015-08-25 MED ORDER — NOREPINEPHRINE 4 MG/250ML-% IV SOLN
INTRAVENOUS | Status: AC
Start: 2015-08-25 — End: 2015-08-25
  Filled 2015-08-25: qty 250

## 2015-08-25 MED ORDER — DEXTROSE 5 % IV SOLN
INTRAVENOUS | Status: DC
  Administered 2015-08-25: 22:00:00 via INTRAVENOUS

## 2015-08-25 MED ORDER — SODIUM CHLORIDE 0.9 % IV SOLN
8.0000 mg/h | INTRAVENOUS | Status: DC
  Administered 2015-08-25 – 2015-08-26 (×2): 8 mg/h via INTRAVENOUS
  Filled 2015-08-25 (×2): qty 80

## 2015-08-25 MED ORDER — NOREPINEPHRINE 4 MG/250ML-% IV SOLN
0.0000 ug/min | Freq: Once | INTRAVENOUS | Status: AC
  Administered 2015-08-25: 4 ug/min via INTRAVENOUS
  Filled 2015-08-25: qty 250

## 2015-08-25 MED ORDER — VANCOMYCIN HCL IN DEXTROSE 1-5 GM/200ML-% IV SOLN
1000.0000 mg | Freq: Once | INTRAVENOUS | Status: AC
  Administered 2015-08-25: 1000 mg via INTRAVENOUS
  Filled 2015-08-25: qty 200

## 2015-08-25 MED ORDER — CHLORHEXIDINE GLUCONATE CLOTH 2 % EX PADS: 6.0000 | MEDICATED_PAD | Freq: Every day | CUTANEOUS | Status: DC

## 2015-08-25 MED ORDER — PIPERACILLIN-TAZOBACTAM 3.375 G IVPB
3.3750 g | Freq: Two times a day (BID) | INTRAVENOUS | Status: DC
Start: 2015-08-26 — End: 2015-08-26
  Administered 2015-08-26 (×2): 3.375 g via INTRAVENOUS
  Filled 2015-08-25 (×3): qty 50

## 2015-08-25 MED ORDER — NOREPINEPHRINE 4 MG/250ML-% IV SOLN
0.0000 ug/min | INTRAVENOUS | Status: DC
  Administered 2015-08-25: 17 ug/min via INTRAVENOUS
  Administered 2015-08-26: 25 ug/min via INTRAVENOUS
  Administered 2015-08-26 (×2): 15 ug/min via INTRAVENOUS
  Administered 2015-08-26: 13 ug/min via INTRAVENOUS
  Administered 2015-08-26: 17 ug/min via INTRAVENOUS
  Filled 2015-08-25 (×4): qty 250

## 2015-08-25 MED ORDER — DIATRIZOATE MEGLUMINE & SODIUM 66-10 % PO SOLN
15.0000 mL | ORAL | Status: AC
  Filled 2015-08-25: qty 30

## 2015-08-25 MED ORDER — SODIUM CHLORIDE 0.45 % IV SOLN
INTRAVENOUS | Status: DC
  Administered 2015-08-25 – 2015-08-26 (×2): via INTRAVENOUS

## 2015-08-25 MED ORDER — LORAZEPAM 2 MG/ML IJ SOLN
1.0000 mg | Freq: Once | INTRAMUSCULAR | Status: AC
  Administered 2015-08-25: 1 mg via INTRAVENOUS

## 2015-08-25 MED ORDER — MUPIROCIN 2 % EX OINT
1.0000 "application " | TOPICAL_OINTMENT | Freq: Two times a day (BID) | CUTANEOUS | Status: DC
  Administered 2015-08-26 (×2): 1 via NASAL
  Filled 2015-08-25: qty 22

## 2015-08-25 MED ORDER — SODIUM CHLORIDE 0.9 % IV SOLN: INTRAVENOUS | Status: DC

## 2015-08-25 MED ORDER — LORAZEPAM 2 MG/ML IJ SOLN
INTRAMUSCULAR | Status: AC
  Administered 2015-08-25: 1 mg via INTRAVENOUS
  Filled 2015-08-25: qty 1

## 2015-08-25 MED ORDER — FENTANYL CITRATE (PF) 100 MCG/2ML IJ SOLN
25.0000 ug | INTRAMUSCULAR | Status: DC | PRN
  Administered 2015-08-25 – 2015-08-26 (×4): 25 ug via INTRAVENOUS
  Filled 2015-08-25 (×4): qty 2

## 2015-08-25 MED ORDER — INSULIN ASPART 100 UNIT/ML ~~LOC~~ SOLN
2.0000 [IU] | SUBCUTANEOUS | Status: DC
  Administered 2015-08-25: 6 [IU] via SUBCUTANEOUS
  Administered 2015-08-26: 2 [IU] via SUBCUTANEOUS
  Filled 2015-08-25: qty 2
  Filled 2015-08-25: qty 6

## 2015-08-25 MED ORDER — NOREPINEPHRINE 4 MG/250ML-% IV SOLN
16.0000 ug/min | Freq: Once | INTRAVENOUS | Status: AC
  Administered 2015-08-25: 0.016 mg/min via INTRAVENOUS

## 2015-08-25 NOTE — ED Notes (Addendum)
MD Mungal at bedside speaking to family and updating family on pt's condition. Pt will be transported to ICU once MD finishes. RT made aware and verbalized to call back once ready.

## 2015-08-25 NOTE — ED Provider Notes (Addendum)
[facility] Emergency Department Provider Note     Time seen: ----------------------------------------- 2:05 PM on 09-20-2015 -----------------------------------------  L5 caveat: Review of systems and history is limited by altered mental status  I have reviewed the triage vital signs and the nursing notes.   HISTORY  Chief Complaint Altered Mental Status    HPI John Thompson is a 82 y.o. male who is brought to the ER emergency traffic for respiratory distress. He is brought in unresponsive on a nonrebreather with active GI bleeding. He presents from [facility] marginally responsive to pain.   Past Medical History  Diagnosis Date  . Vascular dementia   . CVA (cerebral infarction)   . Hypertension   . Diabetes mellitus (HCC)   . COPD (chronic obstructive pulmonary disease) (HCC)   . Chronic respiratory failure with hypoxia (HCC)   . Atrial fibrillation [facility])     Patient Active Problem List   Diagnosis Date Noted  . Atrial fibrillation with rapid ventricular response (HCC) 08/10/2015  . Acute on chronic respiratory failure with hypoxia (HCC) 08/09/2015  . CHF exacerbation (HCC) 08/09/2015  . Healthcare-associated pneumonia 08/09/2015  . Sepsis (HCC) 08/09/2015  . Elevated troponin 08/09/2015  . Prolonged Q-T interval on ECG 08/09/2015  . Atrial fibrillation with RVR (HCC) 08/09/2015  . Acute on chronic renal insufficiency (HCC) 08/09/2015  . Type 2 diabetes mellitus (HCC) 08/09/2015    No past surgical history on file.  Allergies Review of patient's allergies indicates no known allergies.  Social History Social History  Substance Use Topics  . Smoking status: Unknown If Ever Smoked  . Smokeless tobacco: Not on file  . Alcohol Use: Not on file    Review of Systems Unknown at this time, positive for GI bleeding, respiratory distress  ____________________________________________   PHYSICAL EXAM:  VITAL SIGNS: ED Triage Vitals   Enc Vitals Group     BP --      Pulse --      Resp --      Temp --      Temp src --      SpO2 --      Weight --      Height --      Head Cir --      Peak Flow --      Pain Score --      Pain Loc --      Pain Edu? --      Excl. in GC? --     Constitutional: Patient is not alert, severe distress, mostly unresponsive Eyes: Conjunctivae are pale. Pupils are equal ENT   Head: Normocephalic and atraumatic.   Nose: No congestion/rhinnorhea.   Mouth/Throat: Mucous membranes are moist.   Neck: No stridor. Cardiovascular: Normal rate, regular rhythm. No murmurs, rubs, or gallops. Respiratory: Agonal respirations are noted, breath sounds appear clear Gastrointestinal: Soft and nontender. Normal bowel sounds. Active GI bleeding is noted Musculoskeletal: normal range of motion in all extremities. No lower extremity edema. Neurologic:  He 1, V1, M4. Patient intermittently withdraws from pain otherwise unresponsive Skin:  Skin is warm, dry and intact. Pallor is noted ____________________________________________  EKG: Interpreted by me. Atrial fibrillation with a rate of 91 bpm, normal QRS, normal QT interval. Market anterior lateral T wave and ischemic changes  ____________________________________________  ED COURSE:  Pertinent labs & imaging results that were available during my care of the patient were reviewed by me and considered in my medical decision making (see chart for details). Patient  presents hypotensive, unresponsive in shock. He will require rapid sequence intubation, likely blood transfusion. INTUBATION Performed by: Daryel November E  Required items: required blood products, implants, devices, and special equipment available Patient identity confirmed: provided demographic data and hospital-assigned identification number Time out: Immediately prior to procedure a "time out" was called to verify the correct patient, procedure, equipment, support staff and  site/side marked as required.  Indications: Unresponsive, agonal breathing   Intubation method: Glidescope Laryngoscopy   Preoxygenation: 100% BVM  Sedatives: 20 mg  Etomidate Paralytic: 100 mg Succinylcholine  Tube Size: 7.5 cuffed  Post-procedure assessment: chest rise and ETCO2 monitor Breath sounds: equal and absent over the epigastrium Tube secured with: ETT holder Chest x-ray interpreted by radiologist and me.  Chest x-ray findings: 7.5 endotracheal tube in appropriate position  Patient tolerated the procedure well with no immediate complications.  CENTRAL LINE Performed by: Daryel November E Consent: The procedure was performed in an emergent situation. Required items: required blood products, implants, devices, and special equipment available Patient identity confirmed: arm band and provided demographic data Time out: Immediately prior to procedure a "time out" was called to verify the correct patient, procedure, equipment, support staff and site/side marked as required. Indications: vascular access, hemorrhagic shock  Anesthesia: None  Patient sedated: no Preparation: skin prepped with 2% chlorhexidine Skin prep agent dried: skin prep agent completely dried prior to procedure  Location details: Right femoral   Catheter type: Cordis  Pre-procedure: landmarks identified Ultrasound guidance: No  Successful placement: yes Post-procedure: line sutured and dressing applied Assessment: blood return through all parts, free fluid flow, placement verified by x-ray and no pneumothorax on x-ray Patient tolerance: Patient tolerated the procedure well with no immediate complications.  We emergently again transfusing blood, he received 3 units of O+ blood. ____________________________________________    LABS (pertinent positives/negatives)  Labs Reviewed  BLOOD GAS, ARTERIAL - Abnormal; Notable for the following:    pH, Arterial 7.26 (*)    pO2, Arterial 60 (*)     Bicarbonate 14.4 (*)    Acid-base deficit 11.6 (*)    All other components within normal limits  LACTIC ACID, PLASMA - Abnormal; Notable for the following:    Lactic Acid, Venous 3.0 (*)    All other components within normal limits  CBC WITH DIFFERENTIAL/PLATELET - Abnormal; Notable for the following:    WBC 19.0 (*)    Hemoglobin 11.3 (*)    HCT 35.6 (*)    MCH 25.6 (*)    MCHC 31.6 (*)    RDW 16.6 (*)    All other components within normal limits  CULTURE, BLOOD (ROUTINE X 2)  CULTURE, BLOOD (ROUTINE X 2)  URINE CULTURE  LACTIC ACID, PLASMA  COMPREHENSIVE METABOLIC PANEL  TROPONIN I  URINALYSIS COMPLETEWITH MICROSCOPIC ([facility] ONLY)   CRITICAL CARE Performed by: Emily Filbert   Total critical care time: 30 minutes  Critical care time was exclusive of separately billable procedures and treating other patients.  Critical care was necessary to treat or prevent imminent or life-threatening deterioration.  Critical care was time spent personally by me on the following activities: development of treatment plan with patient and/or surrogate as well as nursing, discussions with consultants, evaluation of patient's response to treatment, examination of patient, obtaining history from patient or surrogate, ordering and performing treatments and interventions, ordering and review of laboratory studies, ordering and review of radiographic studies, pulse oximetry and re-evaluation of patient's condition.  RADIOLOGY Images were viewed by me  Chest  x-ray Tubes and lines in good position, no acute cardiopulmonary process ____________________________________________  FINAL ASSESSMENT AND PLAN  Hypotension, rectal bleeding, hypothermia, acute respiratory failure, acidemia  Plan: Patient with labs and imaging as dictated above. Patient presented in shock of unclear etiology. Possibly multifactorial including hemorrhagic and septic. He is received 4 units of emergency release blood,  of the antibiotics and cultures have been obtained. I have discussed with the intensivist for admission. Blood pressure is marginally improved at this point, he'll be started on Levophed.   Emily FilbertWilliams, Jonathan E, MD   Emily FilbertJonathan E Williams, MD 03/21/2016 1446  Emily FilbertJonathan E Williams, MD 03/21/2016 1456  Emily FilbertJonathan E Williams, MD 03/21/2016 [phone]

## 2015-08-25 NOTE — ED Notes (Signed)
Levophed increased to 13 mcg/min

## 2015-08-25 NOTE — ED Notes (Signed)
Family at bedside. 

## 2015-08-25 NOTE — ED Notes (Addendum)
Levophed increased to 11 mcg/min at this time, current BP 83/38. MAP 51

## 2015-08-25 NOTE — ED Notes (Signed)
X-ray at bedside

## 2015-08-25 NOTE — ED Notes (Signed)
RT unable to transport pt at this time, all tied up. Charge RN Tammy SoursGreg made aware and verbalized understanding. Pt will be transported once an RT is available.

## 2015-08-25 NOTE — H&P (Addendum)
PULMONARY / CRITICAL CARE MEDICINE   Name: John NortonJames Thompson MRN: [id number] DOB: 1928-12-31    ADMISSION DATE:  2015/12/31 CONSULTATION DATE: 02-May-2016  REFERRING MD:EDP  CHIEF COMPLAINT: Massive lowe GI bleed  HISTORY OF PRESENT ILLNESS:    John StagersJames Thompson is an 82 year old African American male with past medical history significant for COPD,CHF,  PNA, HTN,, Atrial fibrillation on xarelto.  Patient is [facility]  Patient was recently admitted from 4/3 -4/7 for respiratory distress related to PNA. According to his daughters his health status deteriorating since March, he was brought here and treated for acute CHF recently with 32 pounds weight gain per daughters. He was more aggressive at the nursing home and his anxiety medications were doubled as per his daughters.  Patient was spending more time in bed . He was found to be incontinent of urine for more than once by the family member.Patient was last seen normal by the staff of nursing home on 4/18. Upon this morning patient was found minimally responsive in a pool of blood per rectum.  EMS was called and he was brought to ER, responsive only to painful stimuli, unable to protect airway and was urgently intubated. He had another large blood bowel movement in the ER, and was transfused for a total of 4u PRBC.  R fem cordis placed by ER physician for resuscitation.  CCM was called for admission and further management.  History per review of records and daughters.    PAST MEDICAL HISTORY :  He  has a past medical history of Vascular dementia; CVA (cerebral infarction); Hypertension; Diabetes mellitus (HCC); COPD (chronic obstructive pulmonary disease) (HCC); Chronic respiratory failure with hypoxia (HCC); and Atrial fibrillation (HCC).  PAST SURGICAL HISTORY: He  has no past surgical history on file.  No Known Allergies  No current facility-administered medications on file prior to encounter.   Current  Outpatient Prescriptions on File Prior to Encounter  Medication Sig  . acetaminophen (TYLENOL) 500 MG tablet Take 500 mg by mouth every 4 (four) hours as needed.   . ALPRAZolam (XANAX) 0.5 MG tablet Take 0.5 mg by mouth 2 (two) times daily as needed for anxiety.  . carvedilol (COREG) 6.25 MG tablet Take 6.25 mg by mouth 2 (two) times daily with a meal.  . divalproex (DEPAKOTE ER) 250 MG 24 hr tablet Take 250 mg by mouth 2 (two) times daily.  . feeding supplement, ENSURE ENLIVE, (ENSURE ENLIVE) LIQD Take 237 mLs by mouth 2 (two) times daily between meals.  . finasteride (PROSCAR) 5 MG tablet Take 5 mg by mouth daily.  . furosemide (LASIX) 20 MG tablet Take 20 mg by mouth daily.  Marland Kitchen. guaiFENesin-dextromethorphan (ROBITUSSIN DM) 100-10 MG/5ML syrup Take 10 mLs by mouth every 4 (four) hours as needed for cough.  . hydrocortisone (ANUSOL-HC) 25 MG suppository Place 25 mg rectally at bedtime as needed for hemorrhoids or itching.  . insulin lispro (HUMALOG) 100 UNIT/ML injection Inject 2-9 Units into the skin 3 (three) times daily before meals. Per sliding scale 0-99 0 units 100-149 2 units 150-199 3 units 200-249 4 units 250-299 5 units 300-349 6 units 350-399 7 units 400-449 8 units 450-499 9 units 500 or greater call MD  . ipratropium-albuterol (DUONEB) 0.5-2.5 (3) MG/3ML SOLN Take 3 mLs by nebulization 3 (three) times daily.  Marland Kitchen. ipratropium-albuterol (DUONEB) 0.5-2.5 (3) MG/3ML SOLN Take 3 mLs by nebulization every 6 (six) hours as needed (SOB).  Marland Kitchen. lisinopril (PRINIVIL,ZESTRIL) 2.5 MG  tablet Take 2.5 mg by mouth daily.  . medroxyPROGESTERone (PROVERA) 5 MG tablet Take 5 mg by mouth daily.  . montelukast (SINGULAIR) 10 MG tablet Take 10 mg by mouth at bedtime.  Marland Kitchen PARoxetine (PAXIL) 10 MG tablet Take 10 mg by mouth daily.  . polyethylene glycol (MIRALAX / GLYCOLAX) packet Take 17 g by mouth every other day.  . polyvinyl alcohol (LIQUIFILM TEARS) 1.4 % ophthalmic solution Place 2 drops into both  eyes as needed for dry eyes.  Marland Kitchen risperiDONE (RISPERDAL) 0.5 MG tablet Take 0.5 mg by mouth at bedtime.  . Rivaroxaban (XARELTO) 15 MG TABS tablet Take 15 mg by mouth daily with supper.  . simvastatin (ZOCOR) 20 MG tablet Take 20 mg by mouth daily.  . Skin Protectants, Misc. (EUCERIN) cream Apply 1 application topically as needed for dry skin.  Marland Kitchen spironolactone (ALDACTONE) 25 MG tablet Take 12.5 mg by mouth daily.  . tamsulosin (FLOMAX) 0.4 MG CAPS capsule Take 0.4 mg by mouth daily.    FAMILY HISTORY:  His has no family status information on file.   SOCIAL HISTORY: He  former smoker  REVIEW OF SYSTEMS:   Unable to obtain d/t intubation and critical illness   VITAL SIGNS: BP 91/54 mmHg  Pulse 105  Temp(Src) 96.7 F (35.9 C) (Core (Comment))  Resp 21  Ht  (1.727 m)  Wt 186 lb 1.1 oz (84.4 kg)  BMI 28.30 kg/m2  SpO2 99%  HEMODYNAMICS:    VENTILATOR SETTINGS: Vent Mode:  [-] AC FiO2 (%):  [70 %-100 %] 100 % Set Rate:  [18 bmp-20 bmp] 18 bmp Vt Set:  [450 mL-500 mL] 450 mL PEEP:  [5 cmH20] 5 cmH20  INTAKE / OUTPUT:    PHYSICAL EXAMINATION: General:  Critically ill appearing Neuro:  Intubated and sedated HEENT:  Sluggish pupils Cardiovascular:  S1, S2, irregular Lungs:  Coarse upper airway sounds, dec basilar BS Abdomen:  Distended but soft, +BS Musculoskeletal:  +1 pedal edema Skin:  No lesions  LABS:  BMET  Recent Labs Lab 08/17/2015 1352  NA 160*  K 7.5*  CL 130*  CO2 17*  BUN 141*  CREATININE 6.66*  GLUCOSE 173*    Electrolytes  Recent Labs Lab 09/03/2015 1352  CALCIUM 8.2*    CBC  Recent Labs Lab 08/31/2015 1352  WBC 19.0*  HGB 11.3*  HCT 35.6*  PLT 261    Coag's No results for input(s): APTT, INR in the last 168 hours.  Sepsis Markers  Recent Labs Lab 08/17/2015 1352  LATICACIDVEN 3.0*    ABG  Recent Labs Lab 08/18/2015 1358  PHART 7.26*  PCO2ART 32  PO2ART 60*    Liver Enzymes  Recent Labs Lab 08/13/2015 1352   AST 27  ALT 18  ALKPHOS 60  BILITOT 0.7  ALBUMIN 2.3*    Cardiac Enzymes  Recent Labs Lab 08/18/2015 1352  TROPONINI 0.13*    Glucose No results for input(s): GLUCAP in the last 168 hours.  Imaging Ct Abdomen Pelvis Wo Contrast  08/07/2015  CLINICAL DATA:  Unresponsive. GI bleeding. Found pulseless. Intubated. EXAM: CT ABDOMEN AND PELVIS WITHOUT CONTRAST TECHNIQUE: Multidetector CT imaging of the abdomen and pelvis was performed following the standard protocol without IV contrast. COMPARISON:  Multiple exams, including 10/13/2009 FINDINGS: Lower chest: Airspace opacities in both lower lobes, right greater the left, with truncation of left lower lobe segmental bronchi, appearance favors aspiration pneumonitis or bilateral lower lobe pneumonia. Mild cardiomegaly. Bilateral gynecomastia. Fat density along the interventricular septum  likely from prior infarct. Hepatobiliary: Indistinct gallbladder mostly due to streak artifact from support apparatus, along with motion artifact. Otherwise unremarkable. Pancreas: Unremarkable Spleen: Unremarkable Adrenals/Urinary Tract: Bilateral fluid density lesions are probably renal cysts, enhancement characteristics not assessed. Progressive left renal atrophy compared to the prior exam. No hydronephrosis or hydroureter. Stomach/Bowel: Nasogastric tube in the stomach fundus. Air-fluid level in the rectum. A specific cause for the patient's GI bleeding is not seen. Vascular/Lymphatic: Aortoiliac atherosclerotic vascular disease. Right groin femoral line, small amount of gas in the common femoral veins bilaterally, likely iatrogenic and clinically inconsequential. Reproductive: Massively enlarged prostate gland, prostate volume estimated at 270 cc. The Foley catheter is inflated in the lower portion of the prostate gland, and after deflating the balloon would need to be advanced about 9 cm prior to reinflation in order to provide placement in the urinary bladder.  Nondistention of wall thickening in the urinary bladder noted. Other: No supplemental non-categorized findings. Musculoskeletal: Bridging spurring of both sacroiliac joints. Lower lumbar degenerative facet arthropathy with a right paracentral disc protrusion at the L4-5 level. Moderate to prominent degenerative arthropathy of both hips. IMPRESSION: 1. A specific cause for GI bleeding is not observed. 2. Bibasilar airspace opacities probably from aspiration pneumonitis. Follow up to ensure clearance. 3. Massive enlargement of the prostate gland, approximately 270 cc. The Foley catheter balloon is inflated in the lower prostate gland. If bladder placement of the catheter is warranted, deflation of the balloon and advancement of at least 9 cm is recommended prior to reinflation. Urinary bladder is relatively nondistended a but also thick walled on today' s exam and cystitis is not excluded. 4. Mild cardiomegaly, evidence of prior infarct along the interventricular septum. 5. Bilateral renal hypodense lesions, likely cysts, enhancement characteristics not assessed. 6. Other imaging findings of potential clinical significance: Aortoiliac atherosclerotic vascular disease. Bridging spurring of the sacroiliac joints. Mild lower lumbar spondylosis and degenerative disc disease. Bilateral gynecomastia. NG tube tip in the stomach fundus. Degenerative arthropathy of both hips. Right common femoral catheter noted, gas in the femoral veins likely iatrogenic and inconsequential. Electronically Signed   By: Gaylyn Rong M.D.   On: Sep 02, 2015 15:56   Ct Head Wo Contrast  2015/09/02  CLINICAL DATA:  Unresponsive and respiratory distress EXAM: CT HEAD WITHOUT CONTRAST TECHNIQUE: Contiguous axial images were obtained from the base of the skull through the vertex without intravenous contrast. COMPARISON:  08/18/2014 FINDINGS: Bony calvarium is intact. Diffuse atrophic and chronic white matter ischemic changes are noted. Prior  infarct is noted in the right posterior parietal region stable from the prior exam. No findings to suggest acute hemorrhage, acute infarction or space-occupying mass lesion are noted. IMPRESSION: Chronic atrophic and ischemic changes without acute abnormality. Electronically Signed   By: Alcide Clever M.D.   On: 09/02/15 15:51   Dg Chest Port 1 View  09/02/2015  CLINICAL DATA:  Post intubation EXAM: PORTABLE CHEST 1 VIEW COMPARISON:  08/13/2015 FINDINGS: Endotracheal tube terminates 3 cm above the carina. Mild right lower lobe opacity, likely atelectasis. No pleural effusion or pneumothorax. Cardiomegaly. Enteric tube terminates in the gastric cardia. Defibrillator pads overlying the left hemithorax. IMPRESSION: Endotracheal tube terminates 3 cm above the carina. Electronically Signed   By: Charline Bills M.D.   On: 2015/09/02 14:37   STUDIES CT A/P 4/19>>no perforation, no volvolus, no specific cause of GI bleed CT head 4/19>>Chronic atrophic and ischemic changes without acute abnormality.  CULTURES: Blood x 2 4/19>> Urine 4/19>> Sputum 4/19>>  ANTIBIOTICS: Zosyn  4/19 Vanc 4/19   LINES/TUBES: R Fem cordis 4/19>>  DISCUSSION: 82 yo M PMHx of Vascular Dementia/Alzehemier's, CHF, COPD, Afib on xarelto, now with sepsis and GIB  ASSESSMENT / PLAN:  PULMONARY A: VDRF Pneumonitis ?Aspiration pneumonia RLL Opacity  P:   - cont with MV, wean as tolerated - daily SBT/WUA - maintain o2 sat >88% - ABG prn - sputum culture follow up   CARDIOVASCULAR A:  CHF - systolic and diastolic, EF 30% Cardiomyopathy Atrial Fibrillation Troponin elevation P:  - monitor hemodynamics - pressors to maintain MAP>65 - stop all anticoagulation due to GIB - patient with 4 chamber dilation and severe LV dysfunction - trend CE, possible demand ischemia and GIB  RENAL A:   CKD Acute on Chronic CKD Renal Insufficiency Hypernatremia Lactic acidosis P:   - avoid nephrotoxic drug - start  D5W@50cc /hr - recheck Na - recheck BMP - hypernatremia possibly due to dehydration - gentle IVF - recheck LA - nephro consult if no improvement in labs for possible CRRT evaluation  GASTROINTESTINAL A:   GIB - BRBPR, most likely lower GIB P:   --PPI ggt - monitor H\H - cont with volume resuscitation, s\p 4uPRBC - avoid all anticoagulation - GI Consult - bleeding scan  HEMATOLOGIC A:   Acute Blood loss anemia - s\p 4u PRBC P:  -monitor H\H -maintain Hb>8mg /dl - f\u bleeding scan>>  INFECTIOUS A:   Pneumonia - possible aspiration given RLL PNA ?UTI Pneumonitis P:   - cont with abx as stated above - f\u cultures - PCT>>  ENDOCRINE A:   DM Hyperglycemia P:   - monitor glucose closely since on D5W for hypernatremia - hold oral hypoglycemics - SSI, insulin gtt if needed  NEUROLOGIC A:   Dementia AMS Metabolic Encephalopathy Alzheimer  P:   RASS goal: -1 -correct metabolic abnormalities -monitor neuro status  CODE STATUS - DNR  FAMILY  - Updates:spoke with family at length, updated on clinical status, treatment and management plan.  Daughters made patient DNR - spoke with Family about advance interventions such as dialysis - at this time they do not want any form of dialysis.  Family stated to continue with supportive measures (IVFs, abx, pressors, vent support) at this time.   I have personally obtained a history, examined the patient, evaluated laboratory and imaging results, formulated the assessment and plan and placed orders. CRITICAL CARE: The patient is critically ill with multiple organ systems failure and requires high complexity decision making for assessment and support, frequent evaluation and titration of therapies, application of advanced monitoring technologies and extensive interpretation of multiple databases. Critical Care Time devoted to patient care services described in this note is 55 minutes.    Stephanie Acre, MD [facility] Pager [phone] (please enter 7-digits) On Call Pager - [phone] (please enter 7-digits)

## 2015-08-25 NOTE — ED Notes (Signed)
Levophed increased to 12 mcg/min

## 2015-08-25 NOTE — Progress Notes (Signed)
eLink Physician-Brief Progress Note Patient Name: John NortonJames Poke DOB: Aug 29, 1928 MRN: [id number]   Date of Service  December 15, 2015  HPI/Events of Note  Multiple issues: 1. Waking up - no Rx for pain/sedation and 2. Na+ = 160. 0.9 NaCl running at 75 mL/hour.  eICU Interventions  Will order: 1. Fentanyl 25-50 mcg IV Q 2 hours PRN pain/sedation. 2. Change IV fluid to 0.45 NaCl to run IV at 75 mL/hour.      Intervention Category Major Interventions: Electrolyte abnormality - evaluation and management Minor Interventions: Agitation / anxiety - evaluation and management  Deina Lipsey Eugene December 15, 2015, 10:44 PM

## 2015-08-25 NOTE — ED Notes (Signed)
Pt arrived to ED from [facility] via EMS emergency traffic after being found unresponsive today. Pt was last seen normal last night, per EMS GI bleed x 3 times within the last 24 hours. Pt arrived unresponsive, with no palpated pressure or pulses, apneic and Afib on monitor 70's-100's. [facility] 2 units neg blood rapidly transfused upon arrival, BP 77/52 after infusion completed.

## 2015-08-25 NOTE — ED Notes (Signed)
Levophed increased to 15 mcg/min at time.

## 2015-08-25 NOTE — Progress Notes (Addendum)
Pharmacy Antibiotic Note  John Thompson is a 82 y.o. male admitted on 06-Sep-2015 with pneumonia.  Pharmacy has been consulted for antibiotic dosing. Vancomycin and Zosyn continued per CCM note.  Plan: TBW 74kg Vd 52L kei 0.011 hr-1  T1/2 63 hours Vancomycin 1 gram q 72 hours ordered with stacked dosing. Level before 3rd dose. Goal trough 15-20.  Zosyn 3.375 grams q 12 hours ordered.  Height: 5\' 9"  (175.3 cm) Weight: 162 lb 14.7 oz (73.9 kg) IBW/kg (Calculated) : 70.7  Temp (24hrs), Avg:97.3 F (36.3 C), Min:96 F (35.6 C), Max:98.8 F (37.1 C)   Recent Labs Lab 09/10/15 1352  WBC 19.0*  CREATININE 6.66*  LATICACIDVEN 3.0*    Estimated Creatinine Clearance: 8.3 mL/min (by C-G formula based on Cr of 6.66).    No Known Allergies  Antimicrobials this admission: Vancomycin zosyn  >>    >>   Dose adjustments this admission:   Microbiology results: 4/19 BCx: pending 4/19 UCx: pending  4/19 Sputum Cx pending 4/4 MRSA PCR: (+)  4/19 UA: LE(-) NO2(-) WBC 6-30 4/19 CXR: RLL opacity  Thank you for allowing pharmacy to be a part of this patient's care.  Ghazal Pevey S 06-Sep-2015 11:07 PM

## 2015-08-25 NOTE — ED Notes (Signed)
Pt family at bedside and family updated on delay due to no respiratory therapist transport at this time, but as soon as available pt will be going to ccu

## 2015-08-25 NOTE — ED Notes (Signed)
RN titrated levophed to 10 mcg/min at this time per MD verbal order.

## 2015-08-26 ENCOUNTER — Inpatient Hospital Stay: Payer: Medicare Other

## 2015-08-26 ENCOUNTER — Other Ambulatory Visit: Payer: Medicare Other

## 2015-08-26 DIAGNOSIS — G9341 Metabolic encephalopathy: Secondary | ICD-10-CM

## 2015-08-26 DIAGNOSIS — D62 Acute posthemorrhagic anemia: Secondary | ICD-10-CM

## 2015-08-26 DIAGNOSIS — R571 Hypovolemic shock: Secondary | ICD-10-CM

## 2015-08-26 DIAGNOSIS — F0391 Unspecified dementia with behavioral disturbance: Secondary | ICD-10-CM

## 2015-08-26 LAB — BASIC METABOLIC PANEL
ANION GAP: 12 (ref 5–15)
ANION GAP: 13 (ref 5–15)
BUN: 124 mg/dL — AB (ref 6–20)
BUN: 121 mg/dL — ABNORMAL HIGH (ref 6–20)
CALCIUM: 7.1 mg/dL — AB (ref 8.9–10.3)
CHLORIDE: 124 mmol/L — AB (ref 101–111)
CO2: 18 mmol/L — ABNORMAL LOW (ref 22–32)
CO2: 15 mmol/L — AB (ref 22–32)
CREATININE: 6.31 mg/dL — AB (ref 0.61–1.24)
Calcium: 7.9 mg/dL — ABNORMAL LOW (ref 8.9–10.3)
Chloride: 120 mmol/L — ABNORMAL HIGH (ref 101–111)
Creatinine, Ser: 6.5 mg/dL — ABNORMAL HIGH (ref 0.61–1.24)
GFR calc Af Amer: 8 mL/min — ABNORMAL LOW (ref >60)
GFR, EST AFRICAN AMERICAN: 8 mL/min — AB (ref >60)
GFR, EST NON AFRICAN AMERICAN: 7 mL/min — AB (ref >60)
GFR, EST NON AFRICAN AMERICAN: 7 mL/min — AB (ref >60)
Glucose, Bld: 336 mg/dL — ABNORMAL HIGH (ref 65–99)
Glucose, Bld: 354 mg/dL — ABNORMAL HIGH (ref 65–99)
POTASSIUM: 6.4 mmol/L — AB (ref 3.5–5.1)
Potassium: 5.5 mmol/L — ABNORMAL HIGH (ref 3.5–5.1)
SODIUM: 154 mmol/L — AB (ref 135–145)
SODIUM: 148 mmol/L — AB (ref 135–145)

## 2015-08-26 LAB — GLUCOSE, CAPILLARY
GLUCOSE-CAPILLARY: 191 mg/dL — AB (ref 65–99)
GLUCOSE-CAPILLARY: 183 mg/dL — AB (ref 65–99)
GLUCOSE-CAPILLARY: 159 mg/dL — AB (ref 65–99)
GLUCOSE-CAPILLARY: 135 mg/dL — AB (ref 65–99)
Glucose-Capillary: 122 mg/dL — ABNORMAL HIGH (ref 65–99)
Glucose-Capillary: 126 mg/dL — ABNORMAL HIGH (ref 65–99)
Glucose-Capillary: 192 mg/dL — ABNORMAL HIGH (ref 65–99)
Glucose-Capillary: 242 mg/dL — ABNORMAL HIGH (ref 65–99)
Glucose-Capillary: 261 mg/dL — ABNORMAL HIGH (ref 65–99)
Glucose-Capillary: 279 mg/dL — ABNORMAL HIGH (ref 65–99)
Glucose-Capillary: 280 mg/dL — ABNORMAL HIGH (ref 65–99)
Glucose-Capillary: 344 mg/dL — ABNORMAL HIGH (ref 65–99)

## 2015-08-26 LAB — PHOSPHORUS: PHOSPHORUS: 8.6 mg/dL — AB (ref 2.5–4.6)

## 2015-08-26 LAB — CBC
HCT: 45.2 % (ref 40.0–52.0)
Hemoglobin: 14.3 g/dL (ref 13.0–18.0)
MCH: 26.2 pg (ref 26.0–34.0)
MCHC: 31.7 g/dL — AB (ref 32.0–36.0)
MCV: 82.7 fL (ref 80.0–100.0)
PLATELETS: 190 10*3/uL (ref 150–440)
RBC: 5.47 MIL/uL (ref 4.40–5.90)
RDW: 16.5 % — AB (ref 11.5–14.5)
WBC: 19.7 10*3/uL — AB (ref 3.8–10.6)

## 2015-08-26 LAB — PROCALCITONIN
PROCALCITONIN: 8.23 ng/mL
Procalcitonin: 9.07 ng/mL

## 2015-08-26 LAB — BLOOD GAS, ARTERIAL
ACID-BASE DEFICIT: 13.1 mmol/L — AB (ref 0.0–2.0)
Allens test (pass/fail): POSITIVE — AB
Bicarbonate: 13.5 mEq/L — ABNORMAL LOW (ref 21.0–28.0)
FIO2: 0.7
Mechanical Rate: 18
O2 Saturation: 93.8 %
PEEP: 5 cmH2O
PH ART: 7.22 — AB (ref 7.350–7.450)
Patient temperature: 37
VT: 450 mL
pCO2 arterial: 33 mmHg (ref 32.0–48.0)
pO2, Arterial: 84 mmHg (ref 83.0–108.0)

## 2015-08-26 LAB — TYPE AND SCREEN
ABO/RH(D): A POS
Antibody Screen: NEGATIVE
UNIT DIVISION: 0
Unit division: 0
Unit division: 0
Unit division: 0

## 2015-08-26 LAB — HEMOGLOBIN AND HEMATOCRIT, BLOOD
HEMATOCRIT: 47.1 % (ref 40.0–52.0)
Hemoglobin: 15 g/dL (ref 13.0–18.0)

## 2015-08-26 LAB — LACTIC ACID, PLASMA
LACTIC ACID, VENOUS: 2.4 mmol/L — AB (ref 0.5–2.0)
LACTIC ACID, VENOUS: 2.5 mmol/L — AB (ref 0.5–2.0)

## 2015-08-26 LAB — CBC WITH DIFFERENTIAL/PLATELET
BASOS ABS: 0 10*3/uL (ref 0–0.1)
Basophils Relative: 0 %
EOS ABS: 0 10*3/uL (ref 0–0.7)
Eosinophils Relative: 0 %
HCT: 49.1 % (ref 40.0–52.0)
Hemoglobin: 15.7 g/dL (ref 13.0–18.0)
LYMPHS ABS: 1.6 10*3/uL (ref 1.0–3.6)
Lymphocytes Relative: 7 %
MCH: 26.2 pg (ref 26.0–34.0)
MCHC: 31.9 g/dL — ABNORMAL LOW (ref 32.0–36.0)
MCV: 82.2 fL (ref 80.0–100.0)
Monocytes Absolute: 2.6 10*3/uL — ABNORMAL HIGH (ref 0.2–1.0)
Monocytes Relative: 11 %
NEUTROS ABS: 19.3 10*3/uL — AB (ref 1.4–6.5)
Neutrophils Relative %: 82 %
PLATELETS: 238 10*3/uL (ref 150–440)
RBC: 5.97 MIL/uL — ABNORMAL HIGH (ref 4.40–5.90)
RDW: 16.4 % — AB (ref 11.5–14.5)
WBC: 23.5 10*3/uL — ABNORMAL HIGH (ref 3.8–10.6)

## 2015-08-26 LAB — MAGNESIUM: MAGNESIUM: 2.5 mg/dL — AB (ref 1.7–2.4)

## 2015-08-26 MED ORDER — INSULIN ASPART 100 UNIT/ML IV SOLN
10.0000 [IU] | Freq: Once | INTRAVENOUS | Status: AC
  Administered 2015-08-26: 10 [IU] via INTRAVENOUS
  Filled 2015-08-26: qty 0.1

## 2015-08-26 MED ORDER — MORPHINE SULFATE 25 MG/ML IV SOLN: 5.0000 mg/h | INTRAVENOUS | Status: DC

## 2015-08-26 MED ORDER — VECURONIUM BROMIDE 10 MG IV SOLR
INTRAVENOUS | Status: AC
  Administered 2015-08-26: 10 mg
  Filled 2015-08-26: qty 10

## 2015-08-26 MED ORDER — FENTANYL 2500MCG IN NS 250ML (10MCG/ML) PREMIX INFUSION
25.0000 ug/h | INTRAVENOUS | Status: DC
  Administered 2015-08-26: 50 ug/h via INTRAVENOUS
  Filled 2015-08-26: qty 250

## 2015-08-26 MED ORDER — MIDAZOLAM HCL 2 MG/2ML IJ SOLN
1.0000 mg | INTRAMUSCULAR | Status: DC | PRN
  Administered 2015-08-26 (×2): 1 mg via INTRAVENOUS
  Filled 2015-08-26 (×2): qty 2

## 2015-08-26 MED ORDER — POLYVINYL ALCOHOL 1.4 % OP SOLN
1.0000 [drp] | Freq: Four times a day (QID) | OPHTHALMIC | Status: DC | PRN
  Filled 2015-08-26: qty 15

## 2015-08-26 MED ORDER — GLYCOPYRROLATE 0.2 MG/ML IJ SOLN: 0.2000 mg | INTRAMUSCULAR | Status: DC | PRN

## 2015-08-26 MED ORDER — INSULIN REGULAR HUMAN 100 UNIT/ML IJ SOLN
INTRAMUSCULAR | Status: DC
  Administered 2015-08-26: 2 [IU]/h via INTRAVENOUS
  Filled 2015-08-26: qty 2.5

## 2015-08-26 MED ORDER — STERILE WATER FOR INJECTION IJ SOLN
INTRAMUSCULAR | Status: AC
  Administered 2015-08-26: 10 mL
  Filled 2015-08-26: qty 10

## 2015-08-26 MED ORDER — SODIUM CHLORIDE 0.9 % IV SOLN
1.0000 mg/h | INTRAVENOUS | Status: DC
  Filled 2015-08-26: qty 10

## 2015-08-26 MED ORDER — METOPROLOL TARTRATE 1 MG/ML IV SOLN
5.0000 mg | Freq: Once | INTRAVENOUS | Status: AC
Start: 2015-08-26 — End: 2015-08-26
  Administered 2015-08-26: 5 mg via INTRAVENOUS
  Filled 2015-08-26: qty 5

## 2015-08-26 MED ORDER — FENTANYL CITRATE (PF) 100 MCG/2ML IJ SOLN
50.0000 ug | Freq: Once | INTRAMUSCULAR | Status: AC
  Administered 2015-08-26: 50 ug via INTRAVENOUS

## 2015-08-26 MED ORDER — ACETAMINOPHEN 650 MG RE SUPP: 650.0000 mg | Freq: Four times a day (QID) | RECTAL | Status: DC | PRN

## 2015-08-26 MED ORDER — GLYCOPYRROLATE 1 MG PO TABS
1.0000 mg | ORAL_TABLET | ORAL | Status: DC | PRN
  Filled 2015-08-26: qty 1

## 2015-08-26 MED ORDER — MORPHINE 100MG IN NS 100ML (1MG/ML) PREMIX INFUSION
5.0000 mg/h | INTRAVENOUS | Status: DC
  Administered 2015-08-26: 5 mg/h via INTRAVENOUS
  Filled 2015-08-26 (×2): qty 100

## 2015-08-26 MED ORDER — SODIUM POLYSTYRENE SULFONATE 15 GM/60ML PO SUSP
15.0000 g | Freq: Once | ORAL | Status: AC
  Administered 2015-08-26: 15 g via ORAL
  Filled 2015-08-26: qty 60

## 2015-08-26 MED ORDER — VECURONIUM BROMIDE 10 MG IV SOLR: 10.0000 mg | Freq: Once | INTRAVENOUS | Status: DC

## 2015-08-26 MED ORDER — FENTANYL BOLUS VIA INFUSION
25.0000 ug | INTRAVENOUS | Status: DC | PRN
Start: 2015-08-26 — End: 2015-08-27
  Filled 2015-08-26: qty 25

## 2015-08-26 MED ORDER — DEXTROSE 50 % IV SOLN
1.0000 | Freq: Once | INTRAVENOUS | Status: AC
  Administered 2015-08-26: 50 mL via INTRAVENOUS
  Filled 2015-08-26: qty 50

## 2015-08-26 MED ORDER — MIDAZOLAM HCL 2 MG/2ML IJ SOLN: 1.0000 mg | INTRAMUSCULAR | Status: DC | PRN

## 2015-08-26 MED ORDER — MORPHINE BOLUS VIA INFUSION
2.0000 mg | INTRAVENOUS | Status: DC | PRN
  Filled 2015-08-26: qty 2

## 2015-08-26 MED ORDER — ACETAMINOPHEN 325 MG PO TABS: 650.0000 mg | ORAL_TABLET | Freq: Four times a day (QID) | ORAL | Status: DC | PRN

## 2015-08-27 LAB — ABO/RH: ABO/RH(D): A POS

## 2015-08-27 LAB — URINE CULTURE
CULTURE: NO GROWTH
CULTURE: NO GROWTH

## 2015-08-30 LAB — BLOOD GAS, ARTERIAL
Acid-base deficit: 13.2 mmol/L — ABNORMAL HIGH (ref 0.0–2.0)
Allens test (pass/fail): POSITIVE — AB
BICARBONATE: 13.6 meq/L — AB (ref 21.0–28.0)
FIO2: 0.7
LHR: 18 {breaths}/min
MECHANICAL RATE: 18
O2 SAT: 97.3 %
PATIENT TEMPERATURE: 37
PCO2 ART: 34 mmHg (ref 32.0–48.0)
PEEP/CPAP: 5 cmH2O
PH ART: 7.21 — AB (ref 7.350–7.450)
PO2 ART: 112 mmHg — AB (ref 83.0–108.0)
VT: 450 mL

## 2015-09-03 ENCOUNTER — Ambulatory Visit: Payer: Medicare Other | Admitting: Family

## 2015-09-04 LAB — CULTURE, BLOOD (ROUTINE X 2)
CULTURE: NO GROWTH
Culture: NO GROWTH

## 2015-09-06 NOTE — Progress Notes (Signed)
Pt bs 261, insulin gtt iniatied per orders. Gtt was delayed due to pharmacy delay. Night shift rn called twice requesting gtt.

## 2015-09-06 NOTE — Consult Note (Signed)
GI consult received. Went to see patient - per RN family is extubating and withdrawing care this afternoon. GI blood loss scan cancelled. Had green stool this AM without gross blood or melena.   No further GI recommendations. Please contact if needed.

## 2015-09-06 NOTE — Progress Notes (Addendum)
Inpatient Diabetes Program Recommendations  AACE/ADA: New Consensus Statement on Inpatient Glycemic Control (2015)  Target Ranges:  Prepandial:   less than 140 mg/dL      Peak postprandial:   less than 180 mg/dL (1-2 hours)      Critically ill patients:  140 - 180 mg/dL   Review of Glycemic Control  Results for John NortonWOODS, Mirza (MRN [id number]) as of 09/04/2015 10:39  Ref. Range 08/25/2015 04:23 08/12/2015 07:27 08/30/2015 08:59 08/14/2015 10:00 08/12/2015 10:13  Glucose-Capillary Latest Ref Range: 65-99 mg/dL 782344 (H) 956280 (H) 213261 (H) 279 (H) 242 (H)    Diabetes history: Type 2 Outpatient Diabetes medications: Levemir 25 units qhs, Novolog 2-9 units tid with meals Current orders for Inpatient glycemic control: IV insulin/glucostabilizer orders  Inpatient Diabetes Program Recommendations:   Inpatient Diabetes Program Recommendations: PER PROTOCOL- Discontinue IV insulin drip when.... 1. the insulin infusion rate is < 4 units / hour  2. there are 6 subsequent CBG readings < 180 mg/dL  **Please follow the guidelines in the ICU Glycemic Control Protocol Phase III to transition off drip- Per protocol- Lantus insulin MUST be given 2 hours prior to insulin drip being d/c.   Monitor CBG every 4 hours Administer correction insulin (NOVOLOG) [place] according to sliding scale when the drip is stopped Administer tube feeding coverage (NOVOLOG) as ordered [place] every 4 hours after tubes feeds are at goal. If patient is not receiving TF, it is not at goal, or coverage is not ordered - skip this step.  Susette RacerJulie Barbara Ahart, RN, BA, MHA, CDE Diabetes Coordinator Inpatient Diabetes Program  [phone] (Team Pager) [phone] [facility] Office) 09/05/2015 10:38 AM

## 2015-09-06 NOTE — Progress Notes (Signed)
Pt's blood sugar now 344. Urine output has been minimal and bloody. Dr. Belia HemanKasa with e[facility] notified. Verbal orders to go ahead and start insulin drip. Also, verbal orders to closely watch Pt's urine output given. MD will reassess later.

## 2015-09-06 NOTE — Progress Notes (Signed)
Insulin drip stopped per Dr. Dema SeverinMungal. Patient blood sugar 135

## 2015-09-06 NOTE — Progress Notes (Signed)
Extubated per MD order,no complications

## 2015-09-06 NOTE — Progress Notes (Signed)
Brief Nutrition Assessment:   Pt triggered for assessment due to MST and ventilator status. Noted plan to transition to comfort care, possibly later today [facility]. Nutrition intervention not warranted at this time due to poc. Please re-consult RD if change in poc.    Romelle Starcherate Kymari Nuon MS, RD, LDN [phone] Pager  [phone] Weekend/On-Call Pager

## 2015-09-06 NOTE — Progress Notes (Signed)
Prayer provided for patient and family. °

## 2015-09-06 NOTE — Discharge Summary (Signed)
Death Summary:  82 year old African-American male past medical history of diabetes, Diabetes, CKD, COPD, CHF, pneumonia, hypertension, atrial fibrillation on Xarelto admitted on 09/02/2015 after being found unresponsive and bright red blood per rectum. [facility] by [facility], intubated due to unresponsiveness, had another large bloody bowel movement in the ER. Daughter is at bedside, stated that [facility], has a history of dementia also.  Over the past 2 weeks, he is been more lethargic, spending more time in bed, at times he was found to be incontinent of urine.  In the ER patient was noted to be hypotensive, he was given 4 units of emergency release blood, started on vasopressors, and transferred to the ICU. His renal function continued to worsen, he continued to require pressors, and overall prognosis was poor. He has a cardiac history significant for a ejection fraction of roughly 25-30%, severely reduced systolic function, diffuse hypokinesis, moderate right ventricle dilation, and with overall four-chamber dilation with severe left ventricular dysfunction with depressed EF. Given his poor prognosis, his recent GI bleed, his cardiomyopathy, reduced cardiac function, acute and chronic kidney disease, family decided to withdraw care on Oct 31, 2015.  Diagnoses Cardiomyopathy with four-chamber dilation Systolic heart failure Depressed ejection fraction, 25-35 percent Dementia, vascular, Alzheimer's COPD Hypertension Diabetes Hypovolemic shock Hemorrhagic shock GI bleed Atrial fibrillation   Date of death: 2015-12-09  Time of death:1851     Stephanie AcreVishal Shandra Szymborski, MD [place] [facility] Pager [phone]- [phone] (please enter 7-digits) On Call Pager - [phone] (please enter 7-digits)

## 2015-09-06 NOTE — Progress Notes (Addendum)
        To Whom It May Concern:  Ms. John DukeLinda Thompson father was admitted to [facility] on 08/12/2015 in critical condition, and is still critically ill. Ms. John FlavinGarrick has been at her father's bedside since then, due to his critical medical illness. His prognosis is poor, and comfort care measures were instituted on 10/13/2015.  Please feel free to call [facility] if there are any further questions.    __________________________________  Stephanie AcreVishal Mungal, MD [place] [facility] ICU - [phone]

## 2015-09-06 NOTE — Progress Notes (Signed)
PULMONARY / CRITICAL CARE MEDICINE   Name: John NortonJames Thompson MRN: [id number] DOB: 1928-12-31    ADMISSION DATE:  2015/12/31 CONSULTATION DATE: 02-May-2016  REFERRING MD:EDP  CHIEF COMPLAINT: Massive lowe GI bleed  HISTORY OF PRESENT ILLNESS:    John Thompson is an 82 year old African American male with past medical history significant for COPD,CHF,  PNA, HTN,, Atrial fibrillation on xarelto.  Patient is a [facility]  Patient was recently admitted from 4/3 -4/7 for respiratory distress related to PNA. According to his daughters his health status deteriorating since March, he was brought here and treated for acute CHF recently with 32 pounds weight gain per daughters. He was more aggressive at the nursing home and his anxiety medications were doubled as per his daughters.  Patient was spending more time in bed . He was found to be incontinent of urine for more than once by the family member.Patient was last seen normal by the staff of nursing home on 4/18. Upon this morning patient was found minimally responsive in a pool of blood per rectum.  EMS was called and he was brought to ER, responsive only to painful stimuli, unable to protect airway and was urgently intubated. He had another large blood bowel movement in the ER, and was transfused for a total of 4u PRBC.  R fem cordis placed by ER physician for resuscitation.  CCM was called for admission and further management.  History per review of records and daughters.    PAST MEDICAL HISTORY :  He  has a past medical history of Vascular dementia; CVA (cerebral infarction); Hypertension; Diabetes mellitus (HCC); COPD (chronic obstructive pulmonary disease) (HCC); Chronic respiratory failure with hypoxia (HCC); and Atrial fibrillation (HCC).  PAST SURGICAL HISTORY: He  has no past surgical history on file.  No Known Allergies  No current facility-administered medications on file prior to encounter.   Current  Outpatient Prescriptions on File Prior to Encounter  Medication Sig  . acetaminophen (TYLENOL) 500 MG tablet Take 500 mg by mouth every 4 (four) hours as needed.   . ALPRAZolam (XANAX) 0.5 MG tablet Take 0.5 mg by mouth 2 (two) times daily as needed for anxiety.  . carvedilol (COREG) 6.25 MG tablet Take 6.25 mg by mouth 2 (two) times daily with a meal.  . divalproex (DEPAKOTE ER) 250 MG 24 hr tablet Take 250 mg by mouth 2 (two) times daily.  . feeding supplement, ENSURE ENLIVE, (ENSURE ENLIVE) LIQD Take 237 mLs by mouth 2 (two) times daily between meals.  . finasteride (PROSCAR) 5 MG tablet Take 5 mg by mouth daily.  . furosemide (LASIX) 20 MG tablet Take 20 mg by mouth daily.  Marland Kitchen. guaiFENesin-dextromethorphan (ROBITUSSIN DM) 100-10 MG/5ML syrup Take 10 mLs by mouth every 4 (four) hours as needed for cough.  . hydrocortisone (ANUSOL-HC) 25 MG suppository Place 25 mg rectally at bedtime as needed for hemorrhoids or itching.  . insulin lispro (HUMALOG) 100 UNIT/ML injection Inject 2-9 Units into the skin 3 (three) times daily before meals. Per sliding scale 0-99 0 units 100-149 2 units 150-199 3 units 200-249 4 units 250-299 5 units 300-349 6 units 350-399 7 units 400-449 8 units 450-499 9 units 500 or greater call MD  . ipratropium-albuterol (DUONEB) 0.5-2.5 (3) MG/3ML SOLN Take 3 mLs by nebulization 3 (three) times daily.  Marland Kitchen. ipratropium-albuterol (DUONEB) 0.5-2.5 (3) MG/3ML SOLN Take 3 mLs by nebulization every 6 (six) hours as needed (SOB).  Marland Kitchen. lisinopril (PRINIVIL,ZESTRIL) 2.5 MG  tablet Take 2.5 mg by mouth daily.  . medroxyPROGESTERone (PROVERA) 5 MG tablet Take 5 mg by mouth daily.  . montelukast (SINGULAIR) 10 MG tablet Take 10 mg by mouth at bedtime.  Marland Kitchen PARoxetine (PAXIL) 10 MG tablet Take 10 mg by mouth daily.  . polyethylene glycol (MIRALAX / GLYCOLAX) packet Take 17 g by mouth every other day.  . polyvinyl alcohol (LIQUIFILM TEARS) 1.4 % ophthalmic solution Place 2 drops into both  eyes as needed for dry eyes.  Marland Kitchen risperiDONE (RISPERDAL) 0.5 MG tablet Take 0.5 mg by mouth at bedtime.  . Rivaroxaban (XARELTO) 15 MG TABS tablet Take 15 mg by mouth daily with supper.  . simvastatin (ZOCOR) 20 MG tablet Take 20 mg by mouth daily.  . Skin Protectants, Misc. (EUCERIN) cream Apply 1 application topically as needed for dry skin.  Marland Kitchen spironolactone (ALDACTONE) 25 MG tablet Take 12.5 mg by mouth daily.  . tamsulosin (FLOMAX) 0.4 MG CAPS capsule Take 0.4 mg by mouth daily.    FAMILY HISTORY:  His has no family status information on file.   SOCIAL HISTORY: He  former smoker  REVIEW OF SYSTEMS:   Unable to obtain d/t intubation and critical illness   VITAL SIGNS: BP 101/51 mmHg  Pulse 56  Temp(Src) 99 F (37.2 C) (Core (Comment))  Resp 32  Ht  (1.753 m)  Wt 162 lb 14.7 oz (73.9 kg)  BMI 24.05 kg/m2  SpO2 97%  HEMODYNAMICS:    VENTILATOR SETTINGS: Vent Mode:  [-] PRVC FiO2 (%):  [70 %-100 %] 70 % Set Rate:  [18 bmp] 18 bmp Vt Set:  [450 mL] 450 mL PEEP:  [5 cmH20] 5 cmH20  INTAKE / OUTPUT: I/O last 3 completed shifts: In: 1889.4 [I.V.:1739.4; IV Piggyback:150] Out: 150 [Urine:150]  PHYSICAL EXAMINATION: General:  Critically ill appearing Neuro:  Intubated and sedated HEENT:  Sluggish pupils Cardiovascular:  S1, S2, irregular Lungs:  Coarse upper airway sounds, dec basilar BS Abdomen:  Distended but soft, +BS Musculoskeletal:  +1 pedal edema Skin:  No lesions  LABS:  BMET  Recent Labs Lab 08/17/2015 1352 08/13/2015 2303 Sep 04, 2015 0526  NA 160* 154* 148*  K 7.5* 6.4* 5.5*  CL 130* 124* 120*  CO2 17* 18* 15*  BUN 141* 124* 121*  CREATININE 6.66* 6.50* 6.31*  GLUCOSE 173* 336* 354*    Electrolytes  Recent Labs Lab 09/04/2015 1352 08/23/2015 2303 09/04/15 0526  CALCIUM 8.2* 7.9* 7.1*  MG  --   --  2.5*  PHOS  --   --  8.6*    CBC  Recent Labs Lab 08/18/2015 1352 09/01/2015 2303 2015-09-04 0526 2015-09-04 1321  WBC 19.0* 23.5* 19.7*   --   HGB 11.3* 15.7 14.3 15.0  HCT 35.6* 49.1 45.2 47.1  PLT 261 238 190  --     Coag's  Recent Labs Lab 08/08/2015 2303  APTT 43*  INR 2.00    Sepsis Markers  Recent Labs Lab 09/04/2015 2303 2015-09-04 0231 04-Sep-2015 0526  LATICACIDVEN 2.7* 2.4* 2.5*  PROCALCITON 8.23  --  9.07    ABG  Recent Labs Lab 08/15/2015 1358 Sep 04, 2015 0518 09-04-15 0953  PHART 7.26* 7.22* 7.21*  PCO2ART 32 33 34  PO2ART 60* 84 112*    Liver Enzymes  Recent Labs Lab 09/03/2015 1352  AST 27  ALT 18  ALKPHOS 60  BILITOT 0.7  ALBUMIN 2.3*    Cardiac Enzymes  Recent Labs Lab 09/01/2015 1352  TROPONINI 0.13*    Glucose  Recent Labs Lab 08/27/2015 1131 08/31/2015 1213 08/14/2015 1304 08/21/2015 1401 09/04/2015 1440 08/27/2015 1503  GLUCAP 192* 191* 183* 159* 135* 126*    Imaging Dg Chest Port 1 View  08/09/2015  CLINICAL DATA:  Cc patient.  Acute respiratory failure. EXAM: PORTABLE CHEST 1 VIEW COMPARISON:  15-Feb-2016 FINDINGS: Endotracheal tube has been repositioned and is just about the level of the carina oriented towards the right mainstem bronchus. Repositioning is suggested for better positioning. Enteric tube tip is in the left upper quadrant consistent with location in the upper stomach. Shallow inspiration. Mild cardiac enlargement. Mild infiltration or atelectasis in the right upper lung and both lung bases. No blunting of costophrenic angles. No pneumothorax. IMPRESSION: Endotracheal tube tip appears to be positioned just above the level of the carina and oriented toward the right mainstem bronchus. Repositioning is suggested. Patchy areas of infiltration or atelectasis in the right upper lung and both lung bases. Shallow inspiration. Electronically Signed   By: Burman NievesWilliam  Stevens M.D.   On: 08/14/2015 06:26   STUDIES CT A/P 4/19>>no perforation, no volvolus, no specific cause of GI bleed CT head 4/19>>Chronic atrophic and ischemic changes without acute abnormality.  CULTURES: Blood x  2 4/19>> Urine 4/19>> Sputum 4/19>>  ANTIBIOTICS: Zosyn 4/19 Vanc 4/19   LINES/TUBES: R Fem cordis 4/19>>  DISCUSSION: 82 yo M PMHx of Vascular Dementia/Alzehemier's, CHF, COPD, Afib on xarelto, now with sepsis and GIB  ASSESSMENT / PLAN:  Massive lower GI bleeding Pneumonitis Cardiomyopathy Atrial Fibrillation Acute on Chronic CKD Renal Insufficiency Acute Blood loss anemia - s\p 4u PRBC Dementia AMS Metabolic Encephalopathy Alzheimer's    PLAN : Patient is critically ill with multiple organ system failure, patient is obtunded with poor prognosis. Myself and Dr. Dema SeverinMungal has spoken with the family regarding the condition of the patient therefore family has decided to withdraw care and make him comfort care at this point.     Bincy Varughese,AG-ACNP Pulmonary & Critical Care

## 2015-09-06 NOTE — Progress Notes (Signed)
eLink Physician-Brief Progress Note Patient Name: John NortonJames Murphey DOB: 1929-02-11 MRN: [id number]   Date of Service  08/24/2015  HPI/Events of Note  K=6.4  eICU Interventions  Insulin/d50 kayoxelate 15 g x 1     Intervention Category Major Interventions: Electrolyte abnormality - evaluation and management  Ellyana Crigler 08/16/2015, 2:11 AM

## 2015-09-06 NOTE — Progress Notes (Signed)
Initiated comfort care per orders

## 2015-09-06 NOTE — Progress Notes (Addendum)
      To Whom It May Concern:  Ms. John Thompson father was admitted to [facility] on 08/29/2015 in critical condition, and is still critically ill.  Ms. John Thompson has been at her father's bedside since then, due to his critical medical illness.  His prognosis is poor, and comfort care measures were instituted on 2015/06/10.  Please feel free to call [facility] if there are any further questions.    __________________________________  Stephanie AcreVishal Mungal, MD [place] [facility] and Critical [facility] ICU - [phone]

## 2015-09-06 DEATH — deceased
